# Patient Record
Sex: Female | Born: 1955
Health system: Southern US, Community
[De-identification: ages and names within clinical notes are randomized; demographics above are authoritative.]

## PROBLEM LIST (undated history)

## (undated) DIAGNOSIS — E079 Disorder of thyroid, unspecified: Secondary | ICD-10-CM

## (undated) DIAGNOSIS — I10 Essential (primary) hypertension: Secondary | ICD-10-CM

---

## 2004-10-04 ENCOUNTER — Other Ambulatory Visit: Admission: RE | Admit: 2004-10-04 | Discharge: 2004-10-04 | Payer: Self-pay | Admitting: Obstetrics and Gynecology

## 2004-10-05 ENCOUNTER — Encounter: Admission: RE | Admit: 2004-10-05 | Discharge: 2004-10-05 | Payer: Self-pay | Admitting: Obstetrics and Gynecology

## 2005-02-21 ENCOUNTER — Encounter: Admission: RE | Admit: 2005-02-21 | Discharge: 2005-02-21 | Payer: Self-pay | Admitting: Internal Medicine

## 2005-10-31 ENCOUNTER — Encounter: Admission: RE | Admit: 2005-10-31 | Discharge: 2005-10-31 | Payer: Self-pay | Admitting: Obstetrics and Gynecology

## 2005-11-14 ENCOUNTER — Other Ambulatory Visit: Admission: RE | Admit: 2005-11-14 | Discharge: 2005-11-14 | Payer: Self-pay | Admitting: Obstetrics and Gynecology

## 2006-11-13 ENCOUNTER — Encounter: Admission: RE | Admit: 2006-11-13 | Discharge: 2006-11-13 | Payer: Self-pay | Admitting: Obstetrics and Gynecology

## 2006-11-18 ENCOUNTER — Other Ambulatory Visit: Admission: RE | Admit: 2006-11-18 | Discharge: 2006-11-18 | Payer: Self-pay | Admitting: Obstetrics and Gynecology

## 2007-11-14 ENCOUNTER — Encounter: Admission: RE | Admit: 2007-11-14 | Discharge: 2007-11-14 | Payer: Self-pay | Admitting: Obstetrics and Gynecology

## 2007-11-19 ENCOUNTER — Other Ambulatory Visit: Admission: RE | Admit: 2007-11-19 | Discharge: 2007-11-19 | Payer: Self-pay | Admitting: Obstetrics and Gynecology

## 2008-11-22 ENCOUNTER — Other Ambulatory Visit: Admission: RE | Admit: 2008-11-22 | Discharge: 2008-11-22 | Payer: Self-pay | Admitting: Obstetrics and Gynecology

## 2008-12-27 ENCOUNTER — Encounter: Admission: RE | Admit: 2008-12-27 | Discharge: 2008-12-27 | Payer: Self-pay | Admitting: Obstetrics and Gynecology

## 2009-01-11 ENCOUNTER — Emergency Department (HOSPITAL_COMMUNITY): Admission: EM | Admit: 2009-01-11 | Discharge: 2009-01-12 | Payer: Self-pay | Admitting: Emergency Medicine

## 2009-10-05 ENCOUNTER — Encounter: Admission: RE | Admit: 2009-10-05 | Discharge: 2009-10-05 | Payer: Self-pay | Admitting: Internal Medicine

## 2009-11-25 ENCOUNTER — Ambulatory Visit (HOSPITAL_COMMUNITY): Admission: RE | Admit: 2009-11-25 | Discharge: 2009-11-25 | Payer: Self-pay | Admitting: Internal Medicine

## 2009-12-27 ENCOUNTER — Other Ambulatory Visit
Admission: RE | Admit: 2009-12-27 | Discharge: 2009-12-27 | Payer: Self-pay | Source: Home / Self Care | Admitting: Obstetrics and Gynecology

## 2010-01-11 ENCOUNTER — Encounter
Admission: RE | Admit: 2010-01-11 | Discharge: 2010-01-11 | Payer: Self-pay | Source: Home / Self Care | Attending: Obstetrics and Gynecology | Admitting: Obstetrics and Gynecology

## 2010-01-11 ENCOUNTER — Encounter
Admission: RE | Admit: 2010-01-11 | Discharge: 2010-01-11 | Payer: Self-pay | Source: Home / Self Care | Attending: Family Medicine | Admitting: Family Medicine

## 2010-03-26 LAB — COMPREHENSIVE METABOLIC PANEL
AST: 24 U/L (ref 0–37)
Albumin: 4 g/dL (ref 3.5–5.2)
Calcium: 8.9 mg/dL (ref 8.4–10.5)
Chloride: 104 mEq/L (ref 96–112)
Creatinine, Ser: 0.72 mg/dL (ref 0.4–1.2)
GFR calc Af Amer: >60 mL/min (ref >60)

## 2010-03-26 LAB — CBC
MCHC: 33.2 g/dL (ref 30.0–36.0)
MCV: 92.3 fL (ref 78.0–100.0)
Platelets: 247 10*3/uL (ref 150–400)
WBC: 9.3 10*3/uL (ref 4.0–10.5)

## 2010-03-26 LAB — URINALYSIS, ROUTINE W REFLEX MICROSCOPIC
Bilirubin Urine: NEGATIVE
Hgb urine dipstick: NEGATIVE
Nitrite: NEGATIVE
Specific Gravity, Urine: 1.032 — ABNORMAL HIGH (ref 1.005–1.030)
pH: 5.5 (ref 5.0–8.0)

## 2010-03-26 LAB — DIFFERENTIAL
Eosinophils Relative: 0 % (ref 0–5)
Lymphocytes Relative: 15 % (ref 12–46)
Lymphs Abs: 1.4 10*3/uL (ref 0.7–4.0)
Monocytes Absolute: 0.3 10*3/uL (ref 0.1–1.0)
Monocytes Relative: 4 % (ref 3–12)

## 2010-05-14 ENCOUNTER — Emergency Department (HOSPITAL_COMMUNITY)
Admission: EM | Admit: 2010-05-14 | Discharge: 2010-05-15 | Disposition: A | Discharge status: Home / Self Care | Payer: BC Managed Care – PPO | Attending: Emergency Medicine | Admitting: Emergency Medicine

## 2010-05-14 ENCOUNTER — Emergency Department (HOSPITAL_COMMUNITY): Payer: BC Managed Care – PPO

## 2010-05-14 DIAGNOSIS — S0003XA Contusion of scalp, initial encounter: Secondary | ICD-10-CM | POA: Insufficient documentation

## 2010-05-14 DIAGNOSIS — R51 Headache: Secondary | ICD-10-CM | POA: Insufficient documentation

## 2010-05-14 DIAGNOSIS — IMO0002 Reserved for concepts with insufficient information to code with codable children: Secondary | ICD-10-CM | POA: Insufficient documentation

## 2010-05-14 DIAGNOSIS — Z79899 Other long term (current) drug therapy: Secondary | ICD-10-CM | POA: Insufficient documentation

## 2010-05-14 DIAGNOSIS — S01501A Unspecified open wound of lip, initial encounter: Secondary | ICD-10-CM | POA: Insufficient documentation

## 2010-05-14 DIAGNOSIS — S01502A Unspecified open wound of oral cavity, initial encounter: Secondary | ICD-10-CM | POA: Insufficient documentation

## 2010-05-14 DIAGNOSIS — S0000XA Unspecified superficial injury of scalp, initial encounter: Secondary | ICD-10-CM | POA: Insufficient documentation

## 2010-05-14 DIAGNOSIS — K0889 Other specified disorders of teeth and supporting structures: Secondary | ICD-10-CM | POA: Insufficient documentation

## 2010-05-14 DIAGNOSIS — R220 Localized swelling, mass and lump, head: Secondary | ICD-10-CM | POA: Insufficient documentation

## 2010-05-14 DIAGNOSIS — S0120XA Unspecified open wound of nose, initial encounter: Secondary | ICD-10-CM | POA: Insufficient documentation

## 2010-05-14 DIAGNOSIS — S02109A Fracture of base of skull, unspecified side, initial encounter for closed fracture: Secondary | ICD-10-CM | POA: Insufficient documentation

## 2010-05-14 DIAGNOSIS — S025XXA Fracture of tooth (traumatic), initial encounter for closed fracture: Secondary | ICD-10-CM | POA: Insufficient documentation

## 2010-05-14 DIAGNOSIS — S0510XA Contusion of eyeball and orbital tissues, unspecified eye, initial encounter: Secondary | ICD-10-CM | POA: Insufficient documentation

## 2010-05-14 DIAGNOSIS — S022XXA Fracture of nasal bones, initial encounter for closed fracture: Secondary | ICD-10-CM | POA: Insufficient documentation

## 2010-05-14 DIAGNOSIS — S0280XA Fracture of other specified skull and facial bones, unspecified side, initial encounter for closed fracture: Secondary | ICD-10-CM | POA: Insufficient documentation

## 2010-05-14 DIAGNOSIS — Z189 Retained foreign body fragments, unspecified material: Secondary | ICD-10-CM | POA: Insufficient documentation

## 2010-05-14 DIAGNOSIS — E039 Hypothyroidism, unspecified: Secondary | ICD-10-CM | POA: Insufficient documentation

## 2010-05-14 DIAGNOSIS — J342 Deviated nasal septum: Secondary | ICD-10-CM | POA: Insufficient documentation

## 2010-05-14 LAB — DIFFERENTIAL
Eosinophils Absolute: 0.1 10*3/uL (ref 0.0–0.7)
Eosinophils Relative: 1 % (ref 0–5)
Lymphs Abs: 2.4 10*3/uL (ref 0.7–4.0)
Monocytes Absolute: 0.6 10*3/uL (ref 0.1–1.0)

## 2010-05-14 LAB — COMPREHENSIVE METABOLIC PANEL
ALT: 15 U/L (ref 0–35)
AST: 18 U/L (ref 0–37)
CO2: 25 mEq/L (ref 19–32)
Calcium: 9.5 mg/dL (ref 8.4–10.5)
Chloride: 104 mEq/L (ref 96–112)
GFR calc Af Amer: >60 mL/min (ref >60)
GFR calc non Af Amer: >60 mL/min (ref >60)
Sodium: 138 mEq/L (ref 135–145)

## 2010-05-14 LAB — CBC
MCH: 30.6 pg (ref 26.0–34.0)
MCHC: 34 g/dL (ref 30.0–36.0)
MCV: 90.1 fL (ref 78.0–100.0)
Platelets: 274 10*3/uL (ref 150–400)
RDW: 13.2 % (ref 11.5–15.5)
WBC: 10.2 10*3/uL (ref 4.0–10.5)
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
## 2010-12-28 ENCOUNTER — Other Ambulatory Visit (HOSPITAL_COMMUNITY)
Admission: RE | Admit: 2010-12-28 | Discharge: 2010-12-28 | Disposition: A | Discharge status: Home / Self Care | Payer: BC Managed Care – PPO | Source: Ambulatory Visit | Attending: Obstetrics and Gynecology | Admitting: Obstetrics and Gynecology

## 2010-12-28 ENCOUNTER — Other Ambulatory Visit: Payer: Self-pay | Admitting: Obstetrics and Gynecology

## 2010-12-28 DIAGNOSIS — Z01419 Encounter for gynecological examination (general) (routine) without abnormal findings: Secondary | ICD-10-CM | POA: Insufficient documentation

## 2011-01-29 ENCOUNTER — Other Ambulatory Visit: Payer: Self-pay | Admitting: Obstetrics and Gynecology

## 2011-01-29 ENCOUNTER — Ambulatory Visit
Admission: RE | Admit: 2011-01-29 | Discharge: 2011-01-29 | Disposition: A | Discharge status: Home / Self Care | Payer: BC Managed Care – PPO | Source: Ambulatory Visit | Attending: Obstetrics and Gynecology | Admitting: Obstetrics and Gynecology

## 2011-01-29 DIAGNOSIS — Z1231 Encounter for screening mammogram for malignant neoplasm of breast: Secondary | ICD-10-CM

## 2012-01-16 ENCOUNTER — Other Ambulatory Visit: Payer: Self-pay | Admitting: Obstetrics and Gynecology

## 2012-01-16 ENCOUNTER — Other Ambulatory Visit (HOSPITAL_COMMUNITY)
Admission: RE | Admit: 2012-01-16 | Discharge: 2012-01-16 | Disposition: A | Discharge status: Home / Self Care | Payer: 59 | Source: Ambulatory Visit | Attending: Obstetrics and Gynecology | Admitting: Obstetrics and Gynecology

## 2012-01-16 DIAGNOSIS — Z1151 Encounter for screening for human papillomavirus (HPV): Secondary | ICD-10-CM | POA: Insufficient documentation

## 2012-01-16 DIAGNOSIS — Z01419 Encounter for gynecological examination (general) (routine) without abnormal findings: Secondary | ICD-10-CM | POA: Insufficient documentation

## 2012-02-05 ENCOUNTER — Other Ambulatory Visit: Payer: Self-pay | Admitting: Obstetrics and Gynecology

## 2012-02-05 ENCOUNTER — Ambulatory Visit
Admission: RE | Admit: 2012-02-05 | Discharge: 2012-02-05 | Disposition: A | Discharge status: Home / Self Care | Payer: 59 | Source: Ambulatory Visit | Attending: Obstetrics and Gynecology | Admitting: Obstetrics and Gynecology

## 2012-02-05 DIAGNOSIS — Z1231 Encounter for screening mammogram for malignant neoplasm of breast: Secondary | ICD-10-CM

## 2012-02-07 ENCOUNTER — Other Ambulatory Visit: Payer: Self-pay | Admitting: Obstetrics and Gynecology

## 2012-02-07 DIAGNOSIS — R928 Other abnormal and inconclusive findings on diagnostic imaging of breast: Secondary | ICD-10-CM

## 2012-02-08 ENCOUNTER — Ambulatory Visit
Admission: RE | Admit: 2012-02-08 | Discharge: 2012-02-08 | Disposition: A | Discharge status: Home / Self Care | Payer: 59 | Source: Ambulatory Visit | Attending: Obstetrics and Gynecology | Admitting: Obstetrics and Gynecology

## 2012-02-08 DIAGNOSIS — R928 Other abnormal and inconclusive findings on diagnostic imaging of breast: Secondary | ICD-10-CM

## 2012-03-04 ENCOUNTER — Ambulatory Visit: Payer: 59

## 2012-11-17 ENCOUNTER — Ambulatory Visit (HOSPITAL_COMMUNITY)
Admission: RE | Admit: 2012-11-17 | Discharge: 2012-11-17 | Disposition: A | Discharge status: Home / Self Care | Payer: 59 | Source: Ambulatory Visit | Attending: Pulmonary Disease | Admitting: Pulmonary Disease

## 2012-11-17 DIAGNOSIS — IMO0001 Reserved for inherently not codable concepts without codable children: Secondary | ICD-10-CM | POA: Insufficient documentation

## 2012-11-17 DIAGNOSIS — R269 Unspecified abnormalities of gait and mobility: Secondary | ICD-10-CM | POA: Insufficient documentation

## 2012-11-17 DIAGNOSIS — M25559 Pain in unspecified hip: Secondary | ICD-10-CM | POA: Insufficient documentation

## 2012-11-17 DIAGNOSIS — M25552 Pain in left hip: Secondary | ICD-10-CM

## 2012-11-17 NOTE — Evaluation (Signed)
Physical Therapy Evaluation  Patient Details  Name: Lauren Yates MRN: [id number] Date of Birth: [birth year]  Today's Date: 11/17/2012 Time: 1320-1355 PT Time Calculation (min): 35 min              Visit#: 1 of 8  Re-eval: 12/17/12 Assessment Diagnosis: Lt hip pain Next MD Visit: Dr. Juanetta Gosling  Subjective Symptoms/Limitations Symptoms: Pt is a 57 year old female referred to PT for Lt hip pain which she reports started when she was running which started on October 18th when she was running her 5K and ran then entire 3.2 miles and was training 5 minutes running and 1 minute walking.  She reports that she has pain when she is walking and running.  She reports anterior and groin pain.   Pertinent History: hx of SI dysfunction and herniated disc.  How long can you walk comfortably?: walking is worse when she first starts and improves after she runs and walks  Patient Stated Goals: decrease pain when walking and running.  Pain Assessment Currently in Pain?: Yes Pain Score: 5  Pain Location: Hip Pain Orientation: Left Pain Type: Acute pain Pain Onset: 1 to 4 weeks ago Pain Relieving Factors: naporsen  Effect of Pain on Daily Activities: difficulty running  Prior Functioning  Prior Function Driving: Yes Vocation: Full time employment Vocation Requirements: Insurance account manager from [employer] Comments: She enjoys running  Cognition/Observation Observation/Other Assessments Observations: Scolosis  Sensation/Coordination/Flexibility/Functional Tests Coordination Gross Motor Movements are Fluid and Coordinated: No Coordination and Movement Description: impaired Lt transverese abdominus musculature Flexibility Thomas: Positive Obers: Positive 90/90: Positive Functional Tests Functional Tests: + FABER, + Ely's Functional Tests: FOTO: 44/56  Assessment LLE Strength Left Hip Flexion: 5/5 (painful) Left Hip Extension: 4/5 Left Hip ABduction: 5/5 (painful) Left Hip ADduction: 5/5 Left  Knee Flexion: 4/5 Lumbar Assessment Lumbar Assessment: Exceptions to [facility] Lumbar AROM Overall Lumbar AROM Comments: WFL with approriate SI motion Lumbar Strength Overall Lumbar Strength Comments: Obliques: Lt 4/5, Rt: 5/5 Lumbar Flexion: 5/5 Lumbar Extension: 4/5 Palpation Palpation: pain and tenderness to her Lt groin and maximal muscle spasm to her Lt adductor origin.   Mobility/Balance  Ambulation/Gait Ambulation/Gait: Yes Gait Pattern: Antalgic;Decreased trunk rotation;Lateral hip instability Posture/Postural Control Posture/Postural Control: Postural limitations Postural Limitations: slouched, upper cross   Exercise/Treatments Stretches  IT band 1x20 seconds Hip abduction 1x20 seconds Quadriceps stretch 1x30 seconds prone Hip flexor stretch 1x30 seconds prone with towel roll under knee  Physical Therapy Assessment and Plan PT Assessment and Plan Clinical Impression Statement: Pt is a 57 year old female referred to PT for Lt hip pain with impairments listed below.  After evaluation it is found that she is mostly limited by muscle spasms and increased muscle tone to her hip adductors, quadriceps and hamstrings causing increased pain and impaired coordinated movements to her core muscles.  Pt requires education and cueing for proper posture.  Pt will benefit from skilled therapeutic intervention in order to improve on the following deficits: Abnormal gait;Pain;Decreased strength;Impaired perceived functional ability;Increased fascial restricitons;Increased muscle spasms;Decreased range of motion PT Frequency: Min 2X/week PT Duration: 4 weeks PT Treatment/Interventions: Gait training;Stair training;Functional mobility training;Therapeutic activities;Therapeutic exercise;Balance training;Neuromuscular re-education;Patient/family education;Manual techniques PT Plan: Continue with manual techniques to decrease hip pain.  Improve hip strengthenign to improve gait mechanics (S/L hip  abduction, hip circles, rainbows), squats, quad and hip flexor stretch, gastroc stretch.    Goals Home Exercise Program Pt/caregiver will Perform Home Exercise Program: Independently PT Short Term Goals Time to Complete Short Term Goals:  2 weeks PT Short Term Goal 1: Pt will report Lt hip pain less than 2/10 when walking.  PT Short Term Goal 2: Pt will present with minimal fascial restrictions to her hip and LE musculature to report decreased pain and improve LE flexibilty PT Short Term Goal 3: Pt will perform all MMT without increased pain with resistance. PT Long Term Goals Time to Complete Long Term Goals: 4 weeks PT Long Term Goal 1: Pt will improve Lt hip strength to WNL in order to ambulate and jog without an anatalgic gait to decrease risk of secondary injuries.  PT Long Term Goal 2: Pt will improve her FOTO to status greater than 60 and limiation less than 40 for improved percieved functional ability.    Problem List Patient Active Problem List   Diagnosis Date Noted  . Left hip pain 11/17/2012    General Behavior During Therapy: [facility] for tasks assessed/performed PT Plan of Care PT Home Exercise Plan: given PT Patient Instructions: discussed self massagers, re-joining yoga, posture, core strength Consulted and Agree with Plan of Care: Patient  GP    Kayla Deshaies, MPT, ATC 11/17/2012, 5:48 PM  Physician Documentation Your signature is required to indicate approval of the treatment plan as stated above.  Please sign and either send electronically or make a copy of this report for your files and return this physician signed original.   Please mark one 1.__approve of plan  2. ___approve of plan with the following conditions.   ______________________________                                                          _____________________ Physician Signature                                                                                                             Date

## 2012-11-20 ENCOUNTER — Ambulatory Visit (HOSPITAL_COMMUNITY): Payer: 59

## 2012-11-24 ENCOUNTER — Ambulatory Visit (HOSPITAL_COMMUNITY)
Admission: RE | Admit: 2012-11-24 | Discharge: 2012-11-24 | Disposition: A | Discharge status: Home / Self Care | Payer: 59 | Source: Ambulatory Visit | Attending: Pulmonary Disease | Admitting: Pulmonary Disease

## 2012-11-24 DIAGNOSIS — M25552 Pain in left hip: Secondary | ICD-10-CM

## 2012-11-24 NOTE — Progress Notes (Signed)
Physical Therapy Treatment Patient Details  Name: Lauren Yates MRN: [id number] Date of Birth: 1955-10-22  Today's Date: 11/24/2012 Time: 4098-1191 PT Time Calculation (min): 46 min Charge: Manual 1442-1500, TE 4782-9562  Visit#: 2 of 8  Re-eval: 12/17/12 Assessment Diagnosis: Lt hip pain Next MD Visit: Dr. Juanetta Gosling  Subjective: Symptoms/Limitations Symptoms: Pt stated HEP exercises have been helping with pain.  Stated pain scale 4/10 Lt thigh and back.  Reports running 2 1/2 miles this weekend. Pain Assessment Currently in Pain?: Yes Pain Score: 4  Pain Location: Hip Pain Orientation: Left  Objective:   Exercise/Treatments Stretches Quad Stretch: 3 reps;30 seconds Hip Flexor Stretch: Limitations;3 reps;30 seconds Hip Flexor Stretch Limitations: Prone: manual passive stretch; piegon yoga pose ITB Stretch: 3 reps;30 seconds Piriformis Stretch: 3 reps;30 seconds;Limitations Piriformis Stretch Limitations: pigeon yoga pose Sidelying Hip ABduction: 15 reps;Left;Limitations Hip ABduction Limitations: rainbow 15x, circles forward/backwards 10x  Manual Therapy Manual Therapy: Myofascial release Myofascial Release: MFR to anterior Lt thigh  Physical Therapy Assessment and Plan PT Assessment and Plan Clinical Impression Statement: Manual techniques complete to reduce fascial restriction and passive stretches complete to iliopsoas and hip musculautre.  Began strenghtening exercsies for glut med with min cueing required for form and technique, pt able to demonstrate appropriate technique following ilitial cueing.  Pt educated on importance of appropriate gait mechnaics to assist with posture and pain.  Pt reported pain scale 2/10 at end of session.  Pt encouraged to drink extra water to reduce risk of headaches following manual.   PT Plan: Continue with manual techniques to decrease hip pain.  Improve hip strengthenign to improve gait mechanics (S/L hip abduction, hip circles,  rainbows), squats, quad and hip flexor stretch, gastroc stretch.    Goals Home Exercise Program Pt/caregiver will Perform Home Exercise Program: Independently PT Short Term Goals Time to Complete Short Term Goals: 2 weeks PT Short Term Goal 1: Pt will report Lt hip pain less than 2/10 when walking.  PT Short Term Goal 1 - Progress: Progressing toward goal PT Short Term Goal 2: Pt will present with minimal fascial restrictions to her hip and LE musculature to report decreased pain and improve LE flexibilty PT Short Term Goal 2 - Progress: Progressing toward goal PT Short Term Goal 3: Pt will perform all MMT without increased pain with resistance. PT Long Term Goals Time to Complete Long Term Goals: 4 weeks PT Long Term Goal 1: Pt will improve Lt hip strength to WNL in order to ambulate and jog without an anatalgic gait to decrease risk of secondary injuries.  PT Long Term Goal 1 - Progress: Progressing toward goal PT Long Term Goal 2: Pt will improve her FOTO to status greater than 60 and limiation less than 40 for improved percieved functional ability.    Problem List Patient Active Problem List   Diagnosis Date Noted  . Left hip pain 11/17/2012    PT - End of Session Activity Tolerance: Patient tolerated treatment well General Behavior During Therapy: [facility] for tasks assessed/performed  GP    Juel Burrow 11/24/2012, 4:22 PM

## 2012-11-26 ENCOUNTER — Ambulatory Visit (HOSPITAL_COMMUNITY)
Admission: RE | Admit: 2012-11-26 | Discharge: 2012-11-26 | Disposition: A | Discharge status: Home / Self Care | Payer: 59 | Source: Ambulatory Visit | Attending: Pulmonary Disease | Admitting: Pulmonary Disease

## 2012-11-26 DIAGNOSIS — M25552 Pain in left hip: Secondary | ICD-10-CM

## 2012-11-26 NOTE — Progress Notes (Signed)
Physical Therapy Treatment Patient Details  Name: Lauren Yates MRN: [id number] Date of Birth: 11-Aug-1955  Today's Date: 11/26/2012 Time: 7829-5621 PT Time Calculation (min): 46 min Charges:  TE: 3086-5784 Manual: 1442-1510 Ice: 1510-1520 Visit#: 3 of 8  Re-eval: 12/17/12   Subjective: Symptoms/Limitations Symptoms: reports that she still has pain when she walks too long. states she ran yesterday and it didnt hurt as much and now she is paying for it.  Pain Assessment Currently in Pain?: Yes Pain Score: 5  Pain Location: Hip Pain Orientation: Left  Precautions/Restrictions     Exercise/Treatments Standing Lateral Step Up: Both;15 reps;Step Height: 4";Limitations Lateral Step Up Limitations: Hip Hikes: Both 10 reps Rocker Board: 2 minutes  Modalities Modalities: Cryotherapy Manual Therapy Manual Therapy: Other (comment) Myofascial Release: to Lt anterior thigh, adductor and hamstring with connective tissure release and friction massage to decrease fascial restriction and muscle spasms after SCS.  Other Manual Therapy: strain counter strain (SCS) to adductor insertion x4 locations with 75% release  Cryotherapy Number Minutes Cryotherapy: 10 Minutes Cryotherapy Location: Hip Type of Cryotherapy: Ice pack  Physical Therapy Assessment and Plan PT Assessment and Plan Clinical Impression Statement: Added standing exercises with pictures for pt to continue at home to improve gluteus medius coordinated movement and strength.  Pt agreeable to stop running x1 week to allow for healing and will continue on elliptical and UBE and with weight machines maintain fitness and decrease pain.  Enocurage pt to continue with yoga and discussed use of chondrotin/glucosamine supplements for joint pain and to f/u MD to discuss.  PT Plan: Continue with manual techniques to decrease hip pain.  Improve hip strengthenign to improve gait mechanics (S/L hip abduction, hip circles, rainbows),  squats, quad and hip flexor stretch, gastroc stretch.    Goals    Problem List Patient Active Problem List   Diagnosis Date Noted  . Left hip pain 11/17/2012    PT - End of Session Activity Tolerance: Patient tolerated treatment well General Behavior During Therapy: WFL for tasks assessed/performed PT Plan of Care PT Home Exercise Plan: updated with SLR, external SLR, hip rainbows and hip hikes PT Patient Instructions: chondroitin/glucosamine, ice after running, yoga Consulted and Agree with Plan of Care: Patient  GP    Lauren Yates, MPT, ATC 11/26/2012, 4:17 PM

## 2012-12-01 ENCOUNTER — Ambulatory Visit (HOSPITAL_COMMUNITY): Payer: 59

## 2012-12-03 ENCOUNTER — Ambulatory Visit (HOSPITAL_COMMUNITY)
Admission: RE | Admit: 2012-12-03 | Discharge: 2012-12-03 | Disposition: A | Discharge status: Home / Self Care | Payer: 59 | Source: Ambulatory Visit | Attending: Pulmonary Disease | Admitting: Pulmonary Disease

## 2012-12-03 DIAGNOSIS — M25552 Pain in left hip: Secondary | ICD-10-CM

## 2012-12-03 NOTE — Progress Notes (Signed)
Physical Therapy Treatment Patient Details  Name: Lauren Yates MRN: [id number] Date of Birth: 28-May-1955  Today's Date: 12/03/2012 Time: 7829-5621 PT Time Calculation (min): 30 min Charge: Manual 3086-5784  Visit#: 4 of 8  Re-eval: 12/17/12   Subjective: Symptoms/Limitations Symptoms: Pt reorted Lt hip is feeling better, has been completing [employer] yoga, walking and weight lifting at the gym.  Has not returned to running Pain Assessment Currently in Pain?: Yes Pain Score: 2  Pain Location: Hip Pain Orientation: Left  Objective:  Exercise/Treatments Manual Therapy Myofascial Release: to Lt anterior thigh, adductor and hamstring with connective tissure release and friction massage to decrease fascial restriction and muscle spasms after SCS  Physical Therapy Assessment and Plan PT Assessment and Plan Clinical Impression Statement: Sesion focus on manual techniques to reduce fascial restrictions to reduce pain.  Noted vast improvements in reduce fascial restrictions.  Pt encouraged to continue with yoga and stretching, continue to hold on running. PT Plan: Continue with manual techniques to decrease hip pain.  Improve hip strengthenign to improve gait mechanics (S/L hip abduction, hip circles, rainbows), squats, quad and hip flexor stretch, gastroc stretch.    Goals Home Exercise Program Pt/caregiver will Perform Home Exercise Program: Independently PT Short Term Goals Time to Complete Short Term Goals: 2 weeks PT Short Term Goal 1: Pt will report Lt hip pain less than 2/10 when walking.  PT Short Term Goal 1 - Progress: Progressing toward goal PT Short Term Goal 2: Pt will present with minimal fascial restrictions to her hip and LE musculature to report decreased pain and improve LE flexibilty PT Short Term Goal 2 - Progress: Progressing toward goal PT Short Term Goal 3: Pt will perform all MMT without increased pain with resistance. PT Long Term Goals Time to Complete Long  Term Goals: 4 weeks PT Long Term Goal 1: Pt will improve Lt hip strength to WNL in order to ambulate and jog without an anatalgic gait to decrease risk of secondary injuries.  PT Long Term Goal 2: Pt will improve her FOTO to status greater than 60 and limiation less than 40 for improved percieved functional ability.    Problem List Patient Active Problem List   Diagnosis Date Noted  . Left hip pain 11/17/2012    PT - End of Session Activity Tolerance: Patient tolerated treatment well General Behavior During Therapy: [facility] for tasks assessed/performed  GP    Juel Burrow 12/03/2012, 4:19 PM

## 2012-12-08 ENCOUNTER — Ambulatory Visit (HOSPITAL_COMMUNITY)
Admission: RE | Admit: 2012-12-08 | Discharge: 2012-12-08 | Disposition: A | Discharge status: Home / Self Care | Payer: 59 | Source: Ambulatory Visit | Attending: Pulmonary Disease | Admitting: Pulmonary Disease

## 2012-12-08 DIAGNOSIS — M25552 Pain in left hip: Secondary | ICD-10-CM

## 2012-12-08 DIAGNOSIS — M25559 Pain in unspecified hip: Secondary | ICD-10-CM | POA: Insufficient documentation

## 2012-12-08 DIAGNOSIS — R269 Unspecified abnormalities of gait and mobility: Secondary | ICD-10-CM | POA: Insufficient documentation

## 2012-12-08 DIAGNOSIS — IMO0001 Reserved for inherently not codable concepts without codable children: Secondary | ICD-10-CM | POA: Insufficient documentation

## 2012-12-08 NOTE — Progress Notes (Signed)
Physical Therapy Treatment Patient Details  Name: Lauren Yates MRN: [id number] Date of Birth: [birth year]  Today's Date: 12/08/2012 Time: 4098-1191 PT Time Calculation (min): 40 min Charges: Manual: [id number] TE: 1505-1510 Visit#: 5 of 8  Re-eval: 12/17/12    Authorization:    Authorization Time Period:    Authorization Visit#:   of     Subjective: Symptoms/Limitations Symptoms: Reports she is still not running.  discussed with patient about her bunions she has had for years.  Feels her feet are very weak.   Precautions/Restrictions     Exercise/Treatments Ankle Exercises - Seated Towel Crunch: 1 rep Towel Inversion/Eversion: 1 rep;Weights Towel Inversion/Eversion Weights (lbs): 5 Marble Pickup: 1x10 marbles Other Seated Ankle Exercises: Toe yoga with max cueing and PT facilation Manual Therapy Manual Therapy: Joint mobilization Joint Mobilization: Grade II-III to Rt and Lt metatarsals to improve mobility.  Myofascial Release: to Lt anterior thigh, adductor and hamstring with connective tissure release and friction massage to decrease fascial restriction and muscle spasms.  BLE feet to decrease pain to plantar fascia.   Physical Therapy Assessment and Plan PT Assessment and Plan Clinical Impression Statement: Continued to address adductor pain.  With further conversation with patient, she has great discomfort and notable bunion to Lt foot.  Took MMT of ankle with following findings: Great Toe extension 3+/5; Great toe flexion 2+/5, DF: 5/5, PF: 3-/5, In: 3+/5, Ev: 3/5,.  Added exercises to improve foot and ankle function with signficant adductor compensation with exercises.  PT Plan: contined with focus on foot exercises and core exercises.     Goals    Problem List Patient Active Problem List   Diagnosis Date Noted  . Left hip pain 11/17/2012    PT - End of Session Activity Tolerance: Patient tolerated treatment well General Behavior During Therapy: [facility] for  tasks assessed/performed PT Plan of Care PT Home Exercise Plan: updated with foot exercises Consulted and Agree with Plan of Care: Patient  GP    Lauren Yates 12/08/2012, 6:28 PM

## 2012-12-10 ENCOUNTER — Ambulatory Visit (HOSPITAL_COMMUNITY)
Admission: RE | Admit: 2012-12-10 | Discharge: 2012-12-10 | Disposition: A | Discharge status: Home / Self Care | Payer: 59 | Source: Ambulatory Visit | Attending: Physical Therapy | Admitting: Physical Therapy

## 2012-12-10 DIAGNOSIS — M25552 Pain in left hip: Secondary | ICD-10-CM

## 2012-12-10 NOTE — Progress Notes (Signed)
Physical Therapy Treatment Patient Details  Name: Lauren [place] MRN: [id number] Date of Birth: 04-11-1955  Today's Date: 12/10/2012 Time: 1515-1600 PT Time Calculation (min): 45 min Charges:  TE: 45 Visit#: 6 of 8  Re-eval: 12/17/12    Subjective: Symptoms/Limitations Symptoms: Pt reports that she ran a few steps last and sitll has pain, but it does not hurt as bad. Pain Assessment Pain Score: 2   Exercise/Treatments Machines for Strengthening Cybex Knee Extension: Eccentric lowering BLE 37.5 lbs 1x10 reps each Cybex Knee Flexion: BLE 2x10 BLE 57.5lbs Standing Lateral Step Up: Both;15 reps;Hand Hold: 0;Step Height: 6" Lateral Step Up Limitations: Hip Hikes: Both 10 reps Forward Step Up: Both;20 reps;Limitations;Hand Hold: 0;Step Height: 6" Forward Step Up Limitations: with power ups Walking with Sports Cord: thin: forward, backwards, lateral walking 1 RT each long hallway Ankle Exercises - Standing Heel Raises: 20 reps Ankle Exercises - Seated Towel Crunch: 3 reps Towel Inversion/Eversion: 5 reps;Weights Towel Inversion/Eversion Weights (lbs): 2 Marble Pickup: 3x10 Other Seated Ankle Exercises: Toe yoga with max cueing and PT facilation  Physical Therapy Assessment and Plan PT Assessment and Plan Clinical Impression Statement: Added strengthening activities to BLE that she can continue with at the [facility].  Edcated pt on foot exercises, proper orthotics and shoes.  Discussed activities and had pt demonstrate exercises she can complete at the gym.  Educated pt on 3 min run/1 min walk for exercise. Pt will f/u in 2 weeks to discuss progress and pain.  PT Plan: F/u on progress on her own.     Goals Home Exercise Program PT Goal: Perform Home Exercise Program - Progress: Met PT Short Term Goals Time to Complete Short Term Goals: 2 weeks PT Short Term Goal 1: Pt will report Lt hip pain less than 2/10 when walking.  PT Short Term Goal 1 - Progress: Met PT Short Term Goal 2:  Pt will present with minimal fascial restrictions to her hip and LE musculature to report decreased pain and improve LE flexibilty PT Short Term Goal 2 - Progress: Met PT Short Term Goal 3: Pt will perform all MMT without increased pain with resistance. PT Short Term Goal 3 - Progress: Met PT Long Term Goals Time to Complete Long Term Goals: 4 weeks PT Long Term Goal 1: Pt will improve Lt hip strength to WNL in order to ambulate and jog without an anatalgic gait to decrease risk of secondary injuries.  PT Long Term Goal 1 - Progress: Progressing toward goal PT Long Term Goal 2: Pt will improve her FOTO to status greater than 60 and limiation less than 40 for improved percieved functional ability.   PT Long Term Goal 2 - Progress: Progressing toward goal  Problem List Patient Active Problem List   Diagnosis Date Noted  . Left hip pain 11/17/2012    PT - End of Session Activity Tolerance: Patient tolerated treatment well General Behavior During Therapy: [facility] for tasks assessed/performed PT Plan of Care Consulted and Agree with Plan of Care: Patient  GP    Kasiyah Platter, MPT, ATC 12/10/2012, 3:27 PM

## 2013-01-14 ENCOUNTER — Other Ambulatory Visit: Payer: Self-pay

## 2013-01-14 DIAGNOSIS — Z1231 Encounter for screening mammogram for malignant neoplasm of breast: Secondary | ICD-10-CM

## 2013-01-15 ENCOUNTER — Other Ambulatory Visit: Payer: Self-pay | Admitting: Obstetrics and Gynecology

## 2013-01-15 ENCOUNTER — Other Ambulatory Visit (HOSPITAL_COMMUNITY)
Admission: RE | Admit: 2013-01-15 | Discharge: 2013-01-15 | Disposition: A | Discharge status: Home / Self Care | Payer: 59 | Source: Ambulatory Visit | Attending: Obstetrics and Gynecology | Admitting: Obstetrics and Gynecology

## 2013-01-15 DIAGNOSIS — Z01419 Encounter for gynecological examination (general) (routine) without abnormal findings: Secondary | ICD-10-CM | POA: Insufficient documentation

## 2013-01-15 DIAGNOSIS — Z1151 Encounter for screening for human papillomavirus (HPV): Secondary | ICD-10-CM | POA: Insufficient documentation

## 2013-02-03 ENCOUNTER — Other Ambulatory Visit: Payer: Self-pay | Admitting: Obstetrics and Gynecology

## 2013-02-09 ENCOUNTER — Ambulatory Visit
Admission: RE | Admit: 2013-02-09 | Discharge: 2013-02-09 | Disposition: A | Discharge status: Home / Self Care | Payer: Self-pay | Source: Ambulatory Visit

## 2013-02-09 DIAGNOSIS — Z1231 Encounter for screening mammogram for malignant neoplasm of breast: Secondary | ICD-10-CM

## 2013-08-07 ENCOUNTER — Other Ambulatory Visit: Payer: Self-pay | Admitting: Obstetrics and Gynecology

## 2013-08-07 ENCOUNTER — Other Ambulatory Visit (HOSPITAL_COMMUNITY)
Admission: RE | Admit: 2013-08-07 | Discharge: 2013-08-07 | Disposition: A | Discharge status: Home / Self Care | Payer: 59 | Source: Ambulatory Visit | Attending: Obstetrics and Gynecology | Admitting: Obstetrics and Gynecology

## 2013-08-07 DIAGNOSIS — Z01419 Encounter for gynecological examination (general) (routine) without abnormal findings: Secondary | ICD-10-CM | POA: Diagnosis not present

## 2013-08-11 LAB — CYTOLOGY - PAP

## 2013-09-19 ENCOUNTER — Emergency Department (HOSPITAL_COMMUNITY)
Admission: EM | Admit: 2013-09-19 | Discharge: 2013-09-19 | Disposition: A | Discharge status: Home / Self Care | Payer: 59 | Source: Home / Self Care | Attending: Family Medicine | Admitting: Family Medicine

## 2013-09-19 ENCOUNTER — Encounter (HOSPITAL_COMMUNITY): Payer: Self-pay | Admitting: Emergency Medicine

## 2013-09-19 ENCOUNTER — Emergency Department (INDEPENDENT_AMBULATORY_CARE_PROVIDER_SITE_OTHER): Payer: 59

## 2013-09-19 DIAGNOSIS — S62309A Unspecified fracture of unspecified metacarpal bone, initial encounter for closed fracture: Secondary | ICD-10-CM

## 2013-09-19 DIAGNOSIS — W010XXA Fall on same level from slipping, tripping and stumbling without subsequent striking against object, initial encounter: Secondary | ICD-10-CM

## 2013-09-19 DIAGNOSIS — IMO0002 Reserved for concepts with insufficient information to code with codable children: Secondary | ICD-10-CM

## 2013-09-19 DIAGNOSIS — S62308A Unspecified fracture of other metacarpal bone, initial encounter for closed fracture: Secondary | ICD-10-CM

## 2013-09-19 DIAGNOSIS — S060X0A Concussion without loss of consciousness, initial encounter: Secondary | ICD-10-CM

## 2013-09-19 DIAGNOSIS — T148XXA Other injury of unspecified body region, initial encounter: Secondary | ICD-10-CM

## 2013-09-19 HISTORY — DX: Essential (primary) hypertension: I10

## 2013-09-19 HISTORY — DX: Disorder of thyroid, unspecified: E07.9

## 2013-09-19 NOTE — ED Provider Notes (Signed)
CSN: [id number]     Arrival date & time 09/19/13  1213 History   First MD Initiated Contact with Patient 09/19/13 1242     Chief Complaint  Patient presents with  . Fall   (Consider location/radiation/quality/duration/timing/severity/associated sxs/prior Treatment) HPI  RUnning today around 8:30. Going across bridge and tripped when looking at other runners. R hand extended and broke fall. Hit face on pavement. Wearing glasses at time. Certain motions hurt the lateral hand.  Head trauma: No syncope. HA. Delay in overall thought process per pt. AOx3.  Past Medical History  Diagnosis Date  . Hypertension   . Thyroid disease    Past Surgical History  Procedure Laterality Date  . Cesarean section     History reviewed. No pertinent family history. History  Substance Use Topics  . Smoking status: Never Smoker   . Smokeless tobacco: Not on file  . Alcohol Use: No   OB History   Grav Para Term Preterm Abortions TAB SAB Ect Mult Living                 Review of Systems Per HPI with all other pertinent systems negative.   Allergies  Morphine and related and Penicillins  Home Medications   Prior to Admission medications   Medication Sig Start Date End Date Taking? Authorizing Provider  Citalopram Hydrobromide (CELEXA PO) Take by mouth.   Yes Historical Provider, MD  Levothyroxine Sodium (SYNTHROID PO) Take by mouth.   Yes Historical Provider, MD  LISINOPRIL PO Take by mouth.   Yes Historical Provider, MD   BP 104/74  Pulse 62  Temp(Src) 98.5 F (36.9 C) (Oral)  Resp 18  SpO2 99% Physical Exam  Constitutional: She is oriented to person, place, and time. She appears well-developed and well-nourished. No distress.  HENT:  Head: Normocephalic.  nemerous facial abrasions in the midline, most pronounced over the nasal bridge in the distribution of her glasses. No nasal bridge instability   Eyes: EOM are normal. Pupils are equal, round, and reactive to light.  Neck: Normal  range of motion. Neck supple.  Cardiovascular: Normal rate, normal heart sounds and intact distal pulses.   No murmur heard. Pulmonary/Chest: Effort normal and breath sounds normal.  Abdominal: Soft. She exhibits no distension.  Musculoskeletal: Normal range of motion. She exhibits no edema and no tenderness.  Neurological: She is alert and oriented to person, place, and time. No cranial nerve deficit. She exhibits normal muscle tone. Coordination normal.  Skin: Skin is warm and dry. She is not diaphoretic.  Psychiatric: She has a normal mood and affect. Her behavior is normal. Judgment and thought content normal.    ED Course  Procedures (including critical care time) Labs Review Labs Reviewed - No data to display  Imaging Review Dg Hand Complete Right  09/19/2013   CLINICAL DATA:  History of trauma from a fall complaining of pain in the fifth metacarpal and limited range of motion.  EXAM: RIGHT HAND - COMPLETE 3+ VIEW  COMPARISON:  No priors.  FINDINGS: Three views of the left hand demonstrate an oblique fracture through the base of the fourth metacarpal, which appears nondisplaced. No other acute displaced fracture, subluxation or dislocation is noted.  IMPRESSION: Acute nondisplaced oblique fracture through the base of the fourth metacarpal.   Electronically Signed   By: Vinnie Langton M.D.   On: 09/19/2013 13:56     MDM   1. Abrasion   2. Closed fracture of 4th metacarpal, initial encounter  3. Concussion, without loss of consciousness, initial encounter    Abrasions: antibiotic ointment. F/u PRN  Concussion: mild. Discussed treatment and recovery at great length. Pt well aware of warning signs of concussion and worsening symptoms.  Fracture: ulnar gutter applied by me. Discussed case w/ Dr. Burney Gauze. Pt to f/u on Tue or Wed. NSAIDs and tylenol prn pain  Precautions given and all questions answered  Linna Darner, MD Family Medicine 09/19/2013, 2:35 PM      Waldemar Dickens, MD 09/19/13 1436

## 2013-09-19 NOTE — Discharge Instructions (Signed)
You fractured your right hand Please call the hand specialists on Monday to set up an appointment on Tuesday or Wednesday Please use ibuprofen or tylenol for pain relief You likely also suffered a mild concussion Please take it easy for the next couple of days to help decrease your symptoms of the concussion (no reading, watching TV, running, driving, or exercising in general). Return to activity slowly Please treat your scratches with antibiotic ointment as needed

## 2013-09-19 NOTE — ED Notes (Signed)
Pt  Reports    She  [address]     This am  While  Running  And sustined   Some  Abrasions  To  Face  As  Well  As  An injury  To  her  r hand        She  Has  Pain  When  She  Closes  Her  Hand  As  Well  As  Pain on  Palpation       She  Did  Not  Black out  And  Is      Awake  And  Alert      Oriented

## 2014-01-25 ENCOUNTER — Other Ambulatory Visit: Payer: Self-pay

## 2014-01-25 ENCOUNTER — Other Ambulatory Visit: Payer: Self-pay | Admitting: Obstetrics and Gynecology

## 2014-01-25 ENCOUNTER — Other Ambulatory Visit (HOSPITAL_COMMUNITY)
Admission: RE | Admit: 2014-01-25 | Discharge: 2014-01-25 | Disposition: A | Discharge status: Home / Self Care | Payer: 59 | Source: Ambulatory Visit | Attending: Obstetrics and Gynecology | Admitting: Obstetrics and Gynecology

## 2014-01-25 DIAGNOSIS — Z01419 Encounter for gynecological examination (general) (routine) without abnormal findings: Secondary | ICD-10-CM | POA: Insufficient documentation

## 2014-01-25 DIAGNOSIS — Z1231 Encounter for screening mammogram for malignant neoplasm of breast: Secondary | ICD-10-CM

## 2014-01-26 ENCOUNTER — Other Ambulatory Visit: Payer: Self-pay | Admitting: Internal Medicine

## 2014-01-26 DIAGNOSIS — E042 Nontoxic multinodular goiter: Secondary | ICD-10-CM

## 2014-01-26 LAB — CYTOLOGY - PAP

## 2014-02-10 ENCOUNTER — Ambulatory Visit
Admission: RE | Admit: 2014-02-10 | Discharge: 2014-02-10 | Disposition: A | Discharge status: Home / Self Care | Payer: 59 | Source: Ambulatory Visit

## 2014-02-10 DIAGNOSIS — Z1231 Encounter for screening mammogram for malignant neoplasm of breast: Secondary | ICD-10-CM

## 2014-02-15 ENCOUNTER — Ambulatory Visit
Admission: RE | Admit: 2014-02-15 | Discharge: 2014-02-15 | Disposition: A | Discharge status: Home / Self Care | Payer: 59 | Source: Ambulatory Visit | Attending: Internal Medicine | Admitting: Internal Medicine

## 2014-02-15 DIAGNOSIS — E042 Nontoxic multinodular goiter: Secondary | ICD-10-CM

## 2015-01-24 MED FILL — GAVILYTE-N SOLUTION: 420 | 1 days supply | Qty: 4000 | Fill #0

## 2015-01-31 ENCOUNTER — Other Ambulatory Visit (HOSPITAL_COMMUNITY)
Admission: RE | Admit: 2015-01-31 | Discharge: 2015-01-31 | Disposition: A | Discharge status: Home / Self Care | Payer: 59 | Source: Ambulatory Visit | Attending: Obstetrics and Gynecology | Admitting: Obstetrics and Gynecology

## 2015-01-31 ENCOUNTER — Other Ambulatory Visit: Payer: Self-pay | Admitting: Obstetrics and Gynecology

## 2015-01-31 DIAGNOSIS — N952 Postmenopausal atrophic vaginitis: Secondary | ICD-10-CM | POA: Diagnosis not present

## 2015-01-31 DIAGNOSIS — Z01419 Encounter for gynecological examination (general) (routine) without abnormal findings: Secondary | ICD-10-CM | POA: Diagnosis not present

## 2015-01-31 DIAGNOSIS — Z01411 Encounter for gynecological examination (general) (routine) with abnormal findings: Secondary | ICD-10-CM | POA: Diagnosis not present

## 2015-01-31 DIAGNOSIS — Z1151 Encounter for screening for human papillomavirus (HPV): Secondary | ICD-10-CM | POA: Diagnosis not present

## 2015-02-02 ENCOUNTER — Other Ambulatory Visit: Payer: Self-pay | Admitting: Gastroenterology

## 2015-02-02 DIAGNOSIS — D126 Benign neoplasm of colon, unspecified: Secondary | ICD-10-CM | POA: Diagnosis not present

## 2015-02-02 DIAGNOSIS — D125 Benign neoplasm of sigmoid colon: Secondary | ICD-10-CM | POA: Diagnosis not present

## 2015-02-02 DIAGNOSIS — K5730 Diverticulosis of large intestine without perforation or abscess without bleeding: Secondary | ICD-10-CM | POA: Diagnosis not present

## 2015-02-02 DIAGNOSIS — Z1211 Encounter for screening for malignant neoplasm of colon: Secondary | ICD-10-CM | POA: Diagnosis not present

## 2015-02-02 LAB — CYTOLOGY - PAP

## 2015-02-08 DIAGNOSIS — Z0000 Encounter for general adult medical examination without abnormal findings: Secondary | ICD-10-CM | POA: Diagnosis not present

## 2015-02-08 MED FILL — CELECOXIB 200 MG CAPSULE: 200 | 90 days supply | Qty: 90 | Fill #0

## 2015-02-17 ENCOUNTER — Other Ambulatory Visit: Payer: Self-pay

## 2015-02-17 DIAGNOSIS — Z1231 Encounter for screening mammogram for malignant neoplasm of breast: Secondary | ICD-10-CM

## 2015-02-21 DIAGNOSIS — M1990 Unspecified osteoarthritis, unspecified site: Secondary | ICD-10-CM | POA: Diagnosis not present

## 2015-02-21 DIAGNOSIS — E7800 Pure hypercholesterolemia, unspecified: Secondary | ICD-10-CM | POA: Diagnosis not present

## 2015-02-21 DIAGNOSIS — E039 Hypothyroidism, unspecified: Secondary | ICD-10-CM | POA: Diagnosis not present

## 2015-02-21 DIAGNOSIS — M810 Age-related osteoporosis without current pathological fracture: Secondary | ICD-10-CM | POA: Diagnosis not present

## 2015-02-23 DIAGNOSIS — E042 Nontoxic multinodular goiter: Secondary | ICD-10-CM | POA: Diagnosis not present

## 2015-02-23 DIAGNOSIS — E785 Hyperlipidemia, unspecified: Secondary | ICD-10-CM | POA: Diagnosis not present

## 2015-02-23 DIAGNOSIS — M810 Age-related osteoporosis without current pathological fracture: Secondary | ICD-10-CM | POA: Diagnosis not present

## 2015-02-23 DIAGNOSIS — E039 Hypothyroidism, unspecified: Secondary | ICD-10-CM | POA: Diagnosis not present

## 2015-02-25 MED FILL — SYNTHROID 75 MCG TABLET: 75 | 90 days supply | Qty: 90 | Fill #0

## 2015-02-28 DIAGNOSIS — D485 Neoplasm of uncertain behavior of skin: Secondary | ICD-10-CM | POA: Diagnosis not present

## 2015-02-28 DIAGNOSIS — L84 Corns and callosities: Secondary | ICD-10-CM | POA: Diagnosis not present

## 2015-02-28 DIAGNOSIS — D224 Melanocytic nevi of scalp and neck: Secondary | ICD-10-CM | POA: Diagnosis not present

## 2015-03-03 ENCOUNTER — Ambulatory Visit
Admission: RE | Admit: 2015-03-03 | Discharge: 2015-03-03 | Disposition: A | Discharge status: Home / Self Care | Payer: 59 | Source: Ambulatory Visit

## 2015-03-03 DIAGNOSIS — Z1231 Encounter for screening mammogram for malignant neoplasm of breast: Secondary | ICD-10-CM

## 2015-03-21 DIAGNOSIS — D485 Neoplasm of uncertain behavior of skin: Secondary | ICD-10-CM | POA: Diagnosis not present

## 2015-03-21 DIAGNOSIS — Z872 Personal history of diseases of the skin and subcutaneous tissue: Secondary | ICD-10-CM | POA: Diagnosis not present

## 2015-03-21 DIAGNOSIS — L84 Corns and callosities: Secondary | ICD-10-CM | POA: Diagnosis not present

## 2015-03-21 DIAGNOSIS — D225 Melanocytic nevi of trunk: Secondary | ICD-10-CM | POA: Diagnosis not present

## 2015-03-21 DIAGNOSIS — L821 Other seborrheic keratosis: Secondary | ICD-10-CM | POA: Diagnosis not present

## 2015-03-28 ENCOUNTER — Encounter: Payer: Self-pay | Admitting: Podiatry

## 2015-03-28 ENCOUNTER — Ambulatory Visit (INDEPENDENT_AMBULATORY_CARE_PROVIDER_SITE_OTHER): Payer: 59

## 2015-03-28 ENCOUNTER — Ambulatory Visit (INDEPENDENT_AMBULATORY_CARE_PROVIDER_SITE_OTHER): Payer: 59 | Admitting: Podiatry

## 2015-03-28 VITALS — BP 133/85 | HR 74 | Resp 16 | Ht 65.0 in | Wt 170.0 lb

## 2015-03-28 DIAGNOSIS — M79671 Pain in right foot: Secondary | ICD-10-CM | POA: Diagnosis not present

## 2015-03-28 DIAGNOSIS — M779 Enthesopathy, unspecified: Secondary | ICD-10-CM | POA: Diagnosis not present

## 2015-03-28 DIAGNOSIS — M205X1 Other deformities of toe(s) (acquired), right foot: Secondary | ICD-10-CM | POA: Diagnosis not present

## 2015-03-28 DIAGNOSIS — M2041 Other hammer toe(s) (acquired), right foot: Secondary | ICD-10-CM

## 2015-03-28 MED ORDER — TRIAMCINOLONE ACETONIDE 10 MG/ML IJ SUSP
10.0000 mg | Freq: Once | INTRAMUSCULAR | Status: AC
  Administered 2015-03-28: 10 mg

## 2015-03-28 NOTE — Progress Notes (Signed)
   Subjective:    Patient ID: Lauren [place], female    DOB: [birth year], 60 y.o.   MRN: [id number]  HPI Patient presents with corns on their right foot; 2nd toe-both sides; x1 month   Review of Systems  Musculoskeletal: Positive for arthralgias.  All other systems reviewed and are negative.      Objective:   Physical Exam        Assessment & Plan:

## 2015-03-29 NOTE — Progress Notes (Signed)
Subjective:     Patient ID: Lauren [place], female   DOB: October 25, 1955, 60 y.o.   MRN: [id number]  HPI patient states she's having a lot of pain between the big toe second toe right with inflammation occurring against the toe and she is getting ready to go out of town and wants to see if we can help her with this condition   Review of Systems  All other systems reviewed and are negative.      Objective:   Physical Exam  Constitutional: She is oriented to person, place, and time.  Cardiovascular: Intact distal pulses.   Musculoskeletal: Normal range of motion.  Neurological: She is oriented to person, place, and time.  Skin: Skin is warm.  Nursing note and vitals reviewed.  neurovascular status intact muscle strength adequate range of motion within normal limits with patient noted to have severe limitation of motion first MPJ right with bunion deformity left. There is inflammation between the hallux and second toe and there is inflammatory fluid buildup around the interphalangeal joint right second toe where the hallux is pressed against it. 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 At this point I recommended careful interphalangeal joint injection and it was administered with 1 mg Dexon some 1 mg Kenalog debridement of lesions accomplished and padding was dispensed. I discussed ultimate correction of hallux limitus condition and possible arthroplasty digit 2 right depending on response  X-ray report indicated there is severe arthritis around the first MPJ right with complete distraction of the joint and lateral deviation of the hallux against second toe

## 2015-06-03 MED FILL — CELECOXIB 200 MG CAPSULE: 200 | 90 days supply | Qty: 90 | Fill #1

## 2015-06-24 MED FILL — SYNTHROID 75 MCG TABLET: 75 | 90 days supply | Qty: 90 | Fill #1

## 2015-10-10 MED FILL — SYNTHROID 75 MCG TABLET: 75 | 90 days supply | Qty: 90 | Fill #2

## 2015-11-17 MED FILL — CELECOXIB 200 MG CAPSULE: 200 | 90 days supply | Qty: 90 | Fill #2

## 2015-11-28 DIAGNOSIS — R140 Abdominal distension (gaseous): Secondary | ICD-10-CM | POA: Diagnosis not present

## 2015-11-28 DIAGNOSIS — R143 Flatulence: Secondary | ICD-10-CM | POA: Diagnosis not present

## 2015-11-28 DIAGNOSIS — R198 Other specified symptoms and signs involving the digestive system and abdomen: Secondary | ICD-10-CM | POA: Diagnosis not present

## 2016-01-20 MED FILL — SYNTHROID 75 MCG TABLET: 75 | 90 days supply | Qty: 90 | Fill #3

## 2016-02-07 DIAGNOSIS — I10 Essential (primary) hypertension: Secondary | ICD-10-CM | POA: Diagnosis not present

## 2016-02-28 DIAGNOSIS — E039 Hypothyroidism, unspecified: Secondary | ICD-10-CM | POA: Diagnosis not present

## 2016-02-28 DIAGNOSIS — M810 Age-related osteoporosis without current pathological fracture: Secondary | ICD-10-CM | POA: Diagnosis not present

## 2016-02-29 DIAGNOSIS — Z0000 Encounter for general adult medical examination without abnormal findings: Secondary | ICD-10-CM | POA: Diagnosis not present

## 2016-02-29 MED FILL — LISINOPRIL 5 MG TABLET: 5 | 90 days supply | Qty: 90 | Fill #0

## 2016-03-01 ENCOUNTER — Other Ambulatory Visit: Payer: Self-pay | Admitting: Obstetrics and Gynecology

## 2016-03-01 ENCOUNTER — Other Ambulatory Visit (HOSPITAL_COMMUNITY)
Admission: RE | Admit: 2016-03-01 | Discharge: 2016-03-01 | Disposition: A | Discharge status: Home / Self Care | Payer: 59 | Source: Ambulatory Visit | Attending: Obstetrics and Gynecology | Admitting: Obstetrics and Gynecology

## 2016-03-01 DIAGNOSIS — Z01419 Encounter for gynecological examination (general) (routine) without abnormal findings: Secondary | ICD-10-CM | POA: Insufficient documentation

## 2016-03-01 DIAGNOSIS — Z1151 Encounter for screening for human papillomavirus (HPV): Secondary | ICD-10-CM | POA: Insufficient documentation

## 2016-03-01 DIAGNOSIS — N952 Postmenopausal atrophic vaginitis: Secondary | ICD-10-CM | POA: Diagnosis not present

## 2016-03-01 DIAGNOSIS — E785 Hyperlipidemia, unspecified: Secondary | ICD-10-CM | POA: Diagnosis not present

## 2016-03-01 DIAGNOSIS — Z9189 Other specified personal risk factors, not elsewhere classified: Secondary | ICD-10-CM | POA: Diagnosis not present

## 2016-03-01 DIAGNOSIS — M810 Age-related osteoporosis without current pathological fracture: Secondary | ICD-10-CM | POA: Diagnosis not present

## 2016-03-01 DIAGNOSIS — E039 Hypothyroidism, unspecified: Secondary | ICD-10-CM | POA: Diagnosis not present

## 2016-03-01 DIAGNOSIS — E042 Nontoxic multinodular goiter: Secondary | ICD-10-CM | POA: Diagnosis not present

## 2016-03-06 LAB — CYTOLOGY - PAP
Diagnosis: NEGATIVE
HPV: NOT DETECTED

## 2016-03-29 DIAGNOSIS — L821 Other seborrheic keratosis: Secondary | ICD-10-CM | POA: Diagnosis not present

## 2016-03-29 DIAGNOSIS — L281 Prurigo nodularis: Secondary | ICD-10-CM | POA: Diagnosis not present

## 2016-03-29 DIAGNOSIS — D225 Melanocytic nevi of trunk: Secondary | ICD-10-CM | POA: Diagnosis not present

## 2016-03-29 DIAGNOSIS — L814 Other melanin hyperpigmentation: Secondary | ICD-10-CM | POA: Diagnosis not present

## 2016-03-29 DIAGNOSIS — D1801 Hemangioma of skin and subcutaneous tissue: Secondary | ICD-10-CM | POA: Diagnosis not present

## 2016-04-05 DIAGNOSIS — M810 Age-related osteoporosis without current pathological fracture: Secondary | ICD-10-CM | POA: Diagnosis not present

## 2016-05-11 MED FILL — SYNTHROID 75 MCG TABLET: 75 | 90 days supply | Qty: 90 | Fill #0

## 2016-05-16 ENCOUNTER — Other Ambulatory Visit: Payer: Self-pay | Admitting: Obstetrics and Gynecology

## 2016-05-16 DIAGNOSIS — Z1231 Encounter for screening mammogram for malignant neoplasm of breast: Secondary | ICD-10-CM

## 2016-05-30 ENCOUNTER — Ambulatory Visit: Payer: 59

## 2016-05-31 ENCOUNTER — Ambulatory Visit
Admission: RE | Admit: 2016-05-31 | Discharge: 2016-05-31 | Disposition: A | Discharge status: Home / Self Care | Payer: 59 | Source: Ambulatory Visit | Attending: Obstetrics and Gynecology | Admitting: Obstetrics and Gynecology

## 2016-05-31 DIAGNOSIS — Z1231 Encounter for screening mammogram for malignant neoplasm of breast: Secondary | ICD-10-CM

## 2016-06-05 ENCOUNTER — Other Ambulatory Visit: Payer: Self-pay | Admitting: Obstetrics and Gynecology

## 2016-06-05 DIAGNOSIS — R928 Other abnormal and inconclusive findings on diagnostic imaging of breast: Secondary | ICD-10-CM

## 2016-06-06 ENCOUNTER — Ambulatory Visit
Admission: RE | Admit: 2016-06-06 | Discharge: 2016-06-06 | Disposition: A | Discharge status: Home / Self Care | Payer: 59 | Source: Ambulatory Visit | Attending: Obstetrics and Gynecology | Admitting: Obstetrics and Gynecology

## 2016-06-06 DIAGNOSIS — R928 Other abnormal and inconclusive findings on diagnostic imaging of breast: Secondary | ICD-10-CM

## 2016-06-06 DIAGNOSIS — N6489 Other specified disorders of breast: Secondary | ICD-10-CM | POA: Diagnosis not present

## 2016-06-06 DIAGNOSIS — R922 Inconclusive mammogram: Secondary | ICD-10-CM | POA: Diagnosis not present

## 2016-06-07 ENCOUNTER — Inpatient Hospital Stay
Admission: RE | Admit: 2016-06-07 | Discharge: 2016-06-07 | Disposition: A | Discharge status: Home / Self Care | Payer: 59 | Source: Ambulatory Visit | Attending: Obstetrics and Gynecology | Admitting: Obstetrics and Gynecology

## 2016-06-07 ENCOUNTER — Other Ambulatory Visit: Payer: 59

## 2016-06-07 DIAGNOSIS — S0181XA Laceration without foreign body of other part of head, initial encounter: Secondary | ICD-10-CM | POA: Diagnosis not present

## 2016-06-07 DIAGNOSIS — W19XXXA Unspecified fall, initial encounter: Secondary | ICD-10-CM | POA: Diagnosis not present

## 2016-06-07 DIAGNOSIS — Y9302 Activity, running: Secondary | ICD-10-CM | POA: Diagnosis not present

## 2016-06-07 DIAGNOSIS — S00531A Contusion of lip, initial encounter: Secondary | ICD-10-CM | POA: Diagnosis not present

## 2016-06-15 DIAGNOSIS — H5213 Myopia, bilateral: Secondary | ICD-10-CM | POA: Diagnosis not present

## 2016-06-15 DIAGNOSIS — H52223 Regular astigmatism, bilateral: Secondary | ICD-10-CM | POA: Diagnosis not present

## 2016-06-15 DIAGNOSIS — H524 Presbyopia: Secondary | ICD-10-CM | POA: Diagnosis not present

## 2016-06-22 DIAGNOSIS — M1711 Unilateral primary osteoarthritis, right knee: Secondary | ICD-10-CM | POA: Diagnosis not present

## 2016-07-06 DIAGNOSIS — J018 Other acute sinusitis: Secondary | ICD-10-CM | POA: Diagnosis not present

## 2016-07-16 DIAGNOSIS — M1711 Unilateral primary osteoarthritis, right knee: Secondary | ICD-10-CM | POA: Diagnosis not present

## 2016-07-19 ENCOUNTER — Ambulatory Visit: Payer: 59 | Attending: Orthopaedic Surgery

## 2016-07-19 DIAGNOSIS — R262 Difficulty in walking, not elsewhere classified: Secondary | ICD-10-CM | POA: Diagnosis not present

## 2016-07-19 DIAGNOSIS — M25661 Stiffness of right knee, not elsewhere classified: Secondary | ICD-10-CM | POA: Diagnosis not present

## 2016-07-19 DIAGNOSIS — M1711 Unilateral primary osteoarthritis, right knee: Secondary | ICD-10-CM | POA: Diagnosis not present

## 2016-07-19 DIAGNOSIS — M222X1 Patellofemoral disorders, right knee: Secondary | ICD-10-CM | POA: Insufficient documentation

## 2016-07-19 NOTE — Patient Instructions (Signed)

## 2016-07-19 NOTE — Therapy (Signed)
[place], [place], [address] Phone: [phone]   Fax:  [phone]  Physical Therapy Evaluation  Patient Details  Name: Lauren [place] MRN: [id number] Date of Birth: Aug 16, 1955 Referring Provider: Melrose [place] ,MD  Encounter Date: 07/19/2016      PT End of Session - 07/19/16 1631    Visit Number 1   Number of Visits 12   Date for PT Re-Evaluation 08/31/16   Authorization Type [facility]   PT Start Time 0430   PT Stop Time 0515   PT Time Calculation (min) 45 min   Activity Tolerance Patient tolerated treatment well   Behavior During Therapy [facility] for tasks assessed/performed      Past Medical History:  Diagnosis Date  . Hypertension   . Thyroid disease     Past Surgical History:  Procedure Laterality Date  . CESAREAN SECTION      There were no vitals filed for this visit.       Subjective Assessment - 07/19/16 1640    Subjective She report PFS  with DJD,  and bakers cyst.   She received injection with benefit that did not last. She was runningwhen she began to have pain with severe pain .  Patella has shifted .       Limitations Standing;Walking;House hold activities  not running ( was able to run 5 miles) , work out at gym, rising for chair   Patient Stated Goals REturn to normal activity, gym  running   Currently in Pain? Yes   Pain Score 6    Pain Location Knee   Pain Orientation Right;Medial;Lateral;Anterior   Pain Descriptors / Indicators Aching;Burning   Pain Type Acute pain   Pain Radiating Towards to RT thigh and lower leg at times   Pain Onset 1 to 4 weeks ago   Pain Frequency Constant   Aggravating Factors  Weight bearing, sitting   Pain Relieving Factors Nothing,             [facility] PT Assessment - 07/19/16 0001      Assessment   Medical Diagnosis Knee pain   Referring Provider [place] ,MD   Onset Date/Surgical Date 07/02/16   Next MD Visit As needed   Prior Therapy No     Precautions   Precautions None     Restrictions   Weight Bearing Restrictions No     Balance Screen   Has the patient fallen in the past 6 months Yes   How many times? 1  tripped   Has the patient had a decrease in activity level because of a fear of falling?  No   Is the patient reluctant to leave their home because of a fear of falling?  No     Prior Function   Level of Independence Independent     Cognition   Overall Cognitive Status Within Functional Limits for tasks assessed     Observation/Other Assessments   Focus on Therapeutic Outcomes (FOTO)  68% limited     ROM / Strength   AROM / PROM / Strength AROM;Strength     AROM   AROM Assessment Site Knee   Right/Left Knee Right;Left   Right Knee Extension 0   Right Knee Flexion 120  prone 110   Left Knee Extension 0   Left Knee Flexion 135  115     Strength   Overall Strength Comments Hip strength also 5/5   Strength Assessment Site Knee  Right/Left Knee Right;Left   Right Knee Flexion 5/5   Right Knee Extension 5/5   Left Knee Flexion 5/5   Left Knee Extension 5/5     Flexibility   Soft Tissue Assessment /Muscle Length yes   Hamstrings 70 degrees bilateally     Palpation   Patella mobility WNL with mild tenderness   Palpation comment moderate tenderness medial joint line     Ambulation/Gait   Gait Comments Mild antalgic gait , no device            Objective measurements completed on examination: See above findings.          [facility] Adult PT Treatment/Exercise - 07/19/16 0001      Modalities   Modalities Iontophoresis;Ultrasound;Cryotherapy     Iontophoresis   Type of Iontophoresis Dexamethasone   Location medial RT knee   Dose 1cc   Time 4-6 hours                PT Education - 07/19/16 1632    Education provided Yes   Education Details POC  , ionto patch management, issued red band, suggested ice 10-15 min 2-4x/day   Person(s) Educated Patient    Methods Explanation;Handout   Comprehension Verbalized understanding          PT Short Term Goals - 07/19/16 1734      PT SHORT TERM GOAL #1   Title She will be independent with inital HEP   Time 3   Period Weeks   Status New     PT SHORT TERM GOAL #2   Title She will report pain decreased 30% or more    Time 3   Period Weeks   Status New     PT SHORT TERM GOAL #3   Title RT knee active flexion to 130 degrees   Time 3   Period Weeks   Status New           PT Long Term Goals - 07/19/16 1630      PT LONG TERM GOAL #1   Title She will be independent with all hEP issued   Time 6   Period Weeks   Status New     PT LONG TERM GOAL #2   Title She will be able to walk with 1-2/10 max pain   Time 6   Period Weeks   Status New     PT LONG TERM GOAL #3   Title ROM equal Lt knee    Time 6   Period Weeks   Status New     PT LONG TERM GOAL #4   Title return to gym for aerobic exercise 20-30 min on bike /treadmill or machine that does not incr pain.    Time 6   Period Weeks     PT LONG TERM GOAL #5   Title FOTO score improved to 44% or bette to demo functional improvement   Time 6   Period Weeks   Status New                Plan - 07/19/16 1631    Clinical Impression Statement [name] presents with knee pain limiting activity on feet. Mild antalgic gait.  Her strength is normal but with mild limitation in flexion ROM.   Pain mostly medical along joint line. Tender to touch medial RT knee.      Clinical Presentation Stable   Clinical Decision Making Low   Rehab Potential Good   PT Frequency 2x /  week   PT Duration 6 weeks   PT Treatment/Interventions Electrical Stimulation;Iontophoresis 4mg /ml Dexamethasone;Ultrasound;Passive range of motion;Patient/family education;Therapeutic exercise;Therapeutic activities;Manual techniques   PT Next Visit Plan review and  advance HEP if needed. She is doing HEP from MD and looks appropriate, modalities as  needed  [place] and ionto if no irritation  after today   PT Home Exercise Plan QS , SLR supin/side lye, clams   Consulted and Agree with Plan of Care Patient      Patient will benefit from skilled therapeutic intervention in order to improve the following deficits and impairments:  Decreased activity tolerance, Decreased strength, Pain, Decreased range of motion, Difficulty walking  Visit Diagnosis: Osteoarthritis of right knee, unspecified osteoarthritis type  Patellofemoral syndrome of right knee  Stiffness of right knee, not elsewhere classified  Difficulty in walking, not elsewhere classified     Problem List Patient Active Problem List   Diagnosis Date Noted  . Left hip pain 11/17/2012    Darrel Hoover  PT 07/19/2016, 5:42 PM  [facility] [address] [place], [place], [address] Phone: [phone]   Fax:  [phone]  Name: Lauren [place] MRN: [id number] Date of Birth: 09-11-1955

## 2016-07-25 ENCOUNTER — Ambulatory Visit: Payer: 59 | Admitting: Rehabilitative and Restorative Service Providers"

## 2016-07-25 DIAGNOSIS — M25661 Stiffness of right knee, not elsewhere classified: Secondary | ICD-10-CM

## 2016-07-25 DIAGNOSIS — M1711 Unilateral primary osteoarthritis, right knee: Secondary | ICD-10-CM

## 2016-07-25 DIAGNOSIS — R262 Difficulty in walking, not elsewhere classified: Secondary | ICD-10-CM

## 2016-07-25 DIAGNOSIS — M222X1 Patellofemoral disorders, right knee: Secondary | ICD-10-CM | POA: Diagnosis not present

## 2016-07-25 NOTE — Therapy (Signed)
[place], [place], [address] Phone: [phone]   Fax:  [phone]  Physical Therapy Treatment  Patient Details  Name: Lauren [place] MRN: [id number] Date of Birth: 12/03/1955 Referring Provider: Melrose Nakayama ,MD  Encounter Date: 07/25/2016      PT End of Session - 07/25/16 1149    Visit Number 2   Number of Visits 12   Date for PT Re-Evaluation 08/31/16   Authorization Type [facility]   PT Start Time 0800   PT Stop Time 0850   PT Time Calculation (min) 50 min   Activity Tolerance Patient tolerated treatment well;No increased pain   Behavior During Therapy WFL for tasks assessed/performed      Past Medical History:  Diagnosis Date  . Hypertension   . Thyroid disease     Past Surgical History:  Procedure Laterality Date  . CESAREAN SECTION      There were no vitals filed for this visit.      Subjective Assessment - 07/25/16 1138    Subjective Pt reports no change; really wants to run.   Limitations Standing;Walking;House hold activities   Patient Stated Goals REturn to normal activity, gym  running   Currently in Pain? Yes   Pain Score 6    Pain Location Knee   Pain Orientation Right;Medial   Pain Descriptors / Indicators Aching   Pain Type Acute pain   Pain Onset 1 to 4 weeks ago   Pain Frequency Constant   Aggravating Factors  transitioning from STS   Pain Relieving Factors nothing            [facility] PT Assessment - 07/25/16 0001      Posture/Postural Control   Posture Comments drags L LE with walking and light jog with HOKAs on     Strength   Overall Strength Comments R VMO 3+/5, R hip addct 3+     Palpation   Palpation comment R ASIS/knee lower     Special Tests    Special Tests Knee Special Tests   Knee Special tests  other     other    Comments R Varus/Valgus/Pivot Shift - but tender along MCL                     [facility] Adult PT  Treatment/Exercise - 07/25/16 0001      Exercises   Exercises Knee/Hip     Knee/Hip Exercises: Supine   Other Supine Knee/Hip Exercises R quad set x 10, R SLR x 10, R VMO SLR x 20 (issued also for HEP), R hip adduction in sidelying x 20, prone R hip extension x 20, R donkey kick x 20     Iontophoresis   Type of Iontophoresis Dexamethasone   Location medial R knee   Dose 1 cc   Time 4-6 hours                  PT Short Term Goals - 07/25/16 1152      PT SHORT TERM GOAL #1   Title She will be independent with inital HEP   Time 3   Period Weeks   Status On-going     PT SHORT TERM GOAL #2   Title She will report pain decreased 30% or more    Time 3   Period Weeks   Status On-going     PT SHORT TERM GOAL #3   Title RT knee active flexion to 130  degrees   Time 3   Period Weeks   Status On-going           PT Long Term Goals - 07/19/16 1630      PT LONG TERM GOAL #1   Title She will be independent with all hEP issued   Time 6   Period Weeks   Status New     PT LONG TERM GOAL #2   Title She will be able to walk with 1-2/10 max pain   Time 6   Period Weeks   Status New     PT LONG TERM GOAL #3   Title ROM equal Lt knee    Time 6   Period Weeks   Status New     PT LONG TERM GOAL #4   Title return to gym for aerobic exercise 20-30 min on bike /treadmill or machine that does not incr pain.    Time 6   Period Weeks     PT LONG TERM GOAL #5   Title FOTO score improved to 44% or bette to demo functional improvement   Time 6   Period Weeks   Status New               Plan - 07/25/16 1149    Clinical Impression Statement Pt presents to PT for 2nd PT visit for R knee pain. Pt with over stress to R MCL/medial knee with weakness of R VMO and R hip adductors. Pt would benefit from further PT for VMO/adductor strengthening R and pain management. See if she scheduled with Dr. Oneida Alar. See pt instruction section.   Rehab Potential Good   PT Frequency  2x / week   PT Duration 6 weeks   PT Next Visit Plan review VMO SLR, continue R LE pain management and strengthening with emphasis on VMO and adductors, continue ionto #3   PT Home Exercise Plan QS , SLR supin/side lye, clams, VMO SLR   Consulted and Agree with Plan of Care Patient      Patient will benefit from skilled therapeutic intervention in order to improve the following deficits and impairments:  Decreased activity tolerance, Decreased strength, Pain, Decreased range of motion, Difficulty walking  Visit Diagnosis: Osteoarthritis of right knee, unspecified osteoarthritis type  Stiffness of right knee, not elsewhere classified  Difficulty in walking, not elsewhere classified     Problem List Patient Active Problem List   Diagnosis Date Noted  . Left hip pain 11/17/2012    Myra Rude, PT 07/25/2016, 11:54 AM  [facility] [address] [place], [place], [address] Phone: [phone]   Fax:  [phone]  Name: MARI BATTAGLIA MRN: [id number] Date of Birth: 1955/02/12

## 2016-07-25 NOTE — Patient Instructions (Signed)
Discussed results of special testing all being - but pt tender along MCL with popping and pain along MCL with STS transfer and after pop, pain relieves. With initial transfer, pt noted to have R increased valgas further stressing MCL with education on keeping knee in line with hip with transfer; same with gait and jogging assessment. Pt not to jog until further notice. Discussed seeing Dr. Oneida Alar for diagnostic US to [facility] to see if tear due to pt having increased R knee pain initially from running with new shoes and feeling a pop along MCL.

## 2016-07-30 ENCOUNTER — Ambulatory Visit: Payer: 59

## 2016-07-30 DIAGNOSIS — M1711 Unilateral primary osteoarthritis, right knee: Secondary | ICD-10-CM

## 2016-07-30 DIAGNOSIS — R262 Difficulty in walking, not elsewhere classified: Secondary | ICD-10-CM | POA: Diagnosis not present

## 2016-07-30 DIAGNOSIS — M25661 Stiffness of right knee, not elsewhere classified: Secondary | ICD-10-CM

## 2016-07-30 DIAGNOSIS — M222X1 Patellofemoral disorders, right knee: Secondary | ICD-10-CM

## 2016-07-30 NOTE — Therapy (Signed)
[place], [place], [address] Phone: [phone]   Fax:  [phone]  Physical Therapy Treatment  Patient Details  Name: Lauren Yates MRN: [id number] Date of Birth: 1955-02-13 Referring Provider: Melrose Nakayama ,MD  Encounter Date: 07/30/2016      PT End of Session - 07/30/16 1550    Visit Number 3   Number of Visits 12   Date for PT Re-Evaluation 08/31/16   Authorization Type [facility]   PT Start Time 6962   PT Stop Time 0435   PT Time Calculation (min) 48 min   Activity Tolerance Patient tolerated treatment well;No increased pain   Behavior During Therapy WFL for tasks assessed/performed      Past Medical History:  Diagnosis Date  . Hypertension   . Thyroid disease     Past Surgical History:  Procedure Laterality Date  . CESAREAN SECTION      There were no vitals filed for this visit.      Subjective Assessment - 07/30/16 1550    Subjective Increased pain today. Did not use bike yester day due to gym closed.  Walked. Feels better post recumbant bike   Pain Score 7    Pain Location Knee   Pain Orientation Right;Medial   Pain Descriptors / Indicators Aching   Pain Type Acute pain   Pain Onset 1 to 4 weeks ago   Pain Frequency Constant   Aggravating Factors  getting out of chair   Multiple Pain Sites No                         [facility] Adult PT Treatment/Exercise - 07/30/16 0001      Knee/Hip Exercises: Supine   Other Supine Knee/Hip Exercises RT QSx 5 then x 12 with SLR with ER 30 degrees then side lye abduct x 15 10 with Ir /ER toe to heel touch then clam x 12 in 3 positions with verbal and tactile cues and issued for HEP, then prone hip extension wih RT lift lower than Lt soe stretched and mobs into PA with prone leg figure 4 position with increased range with RT hip SLR    Other Supine Knee/Hip Exercises Then wll sits to 80 degree swithout incr pain 5 sec x 15  reps.      Iontophoresis   Type of Iontophoresis Dexamethasone   Location medial R knee   Dose 1 cc   Time 4-6 hours     Recumbent bike L3 6 min no incr pain.              PT Short Term Goals - 07/30/16 1704      PT SHORT TERM GOAL #1   Title She will be independent with inital HEP   Status Achieved     PT SHORT TERM GOAL #2   Title She will report pain decreased 30% or more    Status On-going     PT SHORT TERM GOAL #3   Title RT knee active flexion to 130 degrees   Status Unable to assess           PT Long Term Goals - 07/19/16 1630      PT LONG TERM GOAL #1   Title She will be independent with all hEP issued   Time 6   Period Weeks   Status New     PT LONG TERM GOAL #2   Title She will be able  to walk with 1-2/10 max pain   Time 6   Period Weeks   Status New     PT LONG TERM GOAL #3   Title ROM equal Lt knee    Time 6   Period Weeks   Status New     PT LONG TERM GOAL #4   Title return to gym for aerobic exercise 20-30 min on bike /treadmill or machine that does not incr pain.    Time 6   Period Weeks     PT LONG TERM GOAL #5   Title FOTO score improved to 44% or bette to demo functional improvement   Time 6   Period Weeks   Status New               Plan - 07/30/16 1659    Clinical Impression Statement Increased pain and limp today but she was able to do all exercises without increased pain.   Skin welted medial RT knee and I cautioned about putting ionto patch on this area but she insisted  so will assess skin next visit and if worse will hold ionto next session.    Possible [place] next session  She is weighing her options for diagnosic [place] and weather syn visc for her.    PT Treatment/Interventions Electrical Stimulation;Iontophoresis 4mg /ml Dexamethasone;Ultrasound;Passive range of motion;Patient/family education;Therapeutic exercise;Therapeutic activities;Manual techniques   PT Next Visit Plan continue R LE pain management and  strengthening with emphasis on VMO and adductors, continue ionto #4 assess ROM   PT Home Exercise Plan QS , SLR supin/side lye, clams with variations , VMO SLR, wall sits  to 80 degrees without incr pain    Consulted and Agree with Plan of Care Patient      Patient will benefit from skilled therapeutic intervention in order to improve the following deficits and impairments:  Decreased activity tolerance, Decreased strength, Pain, Decreased range of motion, Difficulty walking  Visit Diagnosis: Osteoarthritis of right knee, unspecified osteoarthritis type  Stiffness of right knee, not elsewhere classified  Difficulty in walking, not elsewhere classified  Patellofemoral syndrome of right knee     Problem List Patient Active Problem List   Diagnosis Date Noted  . Left hip pain 11/17/2012    Darrel Hoover  PT 07/30/2016, 5:05 PM  [facility] [address] [place], [place], [address] Phone: [phone]   Fax:  [phone]  Name: Lauren Yates MRN: [id number] Date of Birth: 17-Oct-1955

## 2016-08-03 ENCOUNTER — Ambulatory Visit: Payer: 59 | Admitting: Rehabilitative and Restorative Service Providers"

## 2016-08-03 DIAGNOSIS — R262 Difficulty in walking, not elsewhere classified: Secondary | ICD-10-CM

## 2016-08-03 DIAGNOSIS — M1711 Unilateral primary osteoarthritis, right knee: Secondary | ICD-10-CM

## 2016-08-03 DIAGNOSIS — M25661 Stiffness of right knee, not elsewhere classified: Secondary | ICD-10-CM

## 2016-08-03 DIAGNOSIS — M222X1 Patellofemoral disorders, right knee: Secondary | ICD-10-CM | POA: Diagnosis not present

## 2016-08-03 NOTE — Therapy (Signed)
[place], [place], [address] Phone: [phone]   Fax:  [phone]  Physical Therapy Treatment  Patient Details  Name: Lauren Yates MRN: [id number] Date of Birth: [birth year] Referring Provider: Melrose Nakayama ,MD  Encounter Date: 08/03/2016      PT End of Session - 08/03/16 0821    Visit Number 4   Number of Visits 12   Date for PT Re-Evaluation 08/31/16   Authorization Type [facility]   PT Start Time 0805   PT Stop Time 0850   PT Time Calculation (min) 45 min   Activity Tolerance Patient tolerated treatment well;No increased pain   Behavior During Therapy WFL for tasks assessed/performed      Past Medical History:  Diagnosis Date  . Hypertension   . Thyroid disease     Past Surgical History:  Procedure Laterality Date  . CESAREAN SECTION      There were no vitals filed for this visit.      Subjective Assessment - 08/03/16 0822    Subjective No pain this morning but yesterday it hurt pretty bad on the inside of my knee. It is getting better though.   Limitations Standing;Walking;House hold activities   How long can you sit comfortably? 30 min   How long can you stand comfortably? 30 min   How long can you walk comfortably? 30-45 min   Patient Stated Goals REturn to normal activity, gym  running   Currently in Pain? No/denies  pt reports pain yesterday to be up to 6/10                         [facility] Adult PT Treatment/Exercise - 08/03/16 0001      Knee/Hip Exercises: Aerobic   Elliptical level 1, incline 1 x 10 min with PT verbal cues and monitoring of keeping hips slightly abducted to facilitate more glute med strengthening     Knee/Hip Exercises: Standing   Other Standing Knee Exercises yellow weighted ball wall slides x 20; plies x 20; isometric weighted ball slide hold 2x5 sec each; isometric plies x 5 x 5 sec; standing R ITB stretch 2x30 sec     Knee/Hip  Exercises: Supine   Other Supine Knee/Hip Exercises R SLR x 20, R VMO SLR x 20, 2 lb VMO SLR x 20, 2 lb SLR x 20; 2 lb hip flex/abdct off of the side of the bed to neutral x 20; 2 lb hip addct to midline x 20     Iontophoresis   Type of Iontophoresis Dexamethasone   Location medial R knee   Dose 1 cc   Time 4-6 hours                PT Education - 08/03/16 0855    Education provided Yes   Education Details answered pt questions regarding sleeping supine with pillow under knees   Person(s) Educated Patient   Methods Explanation   Comprehension Verbalized understanding          PT Short Term Goals - 07/30/16 1704      PT SHORT TERM GOAL #1   Title She will be independent with inital HEP   Status Achieved     PT SHORT TERM GOAL #2   Title She will report pain decreased 30% or more    Status On-going     PT SHORT TERM GOAL #3   Title RT knee active flexion to 130  degrees   Status Unable to assess           PT Long Term Goals - 07/19/16 1630      PT LONG TERM GOAL #1   Title She will be independent with all hEP issued   Time 6   Period Weeks   Status New     PT LONG TERM GOAL #2   Title She will be able to walk with 1-2/10 max pain   Time 6   Period Weeks   Status New     PT LONG TERM GOAL #3   Title ROM equal Lt knee    Time 6   Period Weeks   Status New     PT LONG TERM GOAL #4   Title return to gym for aerobic exercise 20-30 min on bike /treadmill or machine that does not incr pain.    Time 6   Period Weeks     PT LONG TERM GOAL #5   Title FOTO score improved to 44% or bette to demo functional improvement   Time 6   Period Weeks   Status New               Plan - 08/03/16 8185    Clinical Impression Statement Pt presents to PT with reports of improvement of R medial knee pain. Pt noted to have a pocket of swelling medial to patella tendon. No popping or crepitus palpated with movement but pt reports functional popping with  transitional movements throughout the day. She needed mod verbal cues for control of movement and proper muscle activation. Pt would benefit from PT for further R LE strengthening with emphasis on VMo and pain management. Ionto patch applied    Rehab Potential Good   PT Frequency 2x / week   PT Duration 6 weeks   PT Treatment/Interventions Electrical Stimulation;Iontophoresis 4mg /ml Dexamethasone;Ultrasound;Passive range of motion;Patient/family education;Therapeutic exercise;Therapeutic activities;Manual techniques   PT Next Visit Plan continue R LE pain management and strengthening with emphasis on VMO and adductors, continue ionto #5 assess ROM   PT Home Exercise Plan QS , SLR supin/side lye, clams with variations , VMO SLR, wall sits  to 80 degrees without incr pain    Consulted and Agree with Plan of Care Patient      Patient will benefit from skilled therapeutic intervention in order to improve the following deficits and impairments:  Decreased activity tolerance, Decreased strength, Pain, Decreased range of motion, Difficulty walking  Visit Diagnosis: Osteoarthritis of right knee, unspecified osteoarthritis type  Stiffness of right knee, not elsewhere classified  Difficulty in walking, not elsewhere classified  Patellofemoral syndrome of right knee     Problem List Patient Active Problem List   Diagnosis Date Noted  . Left hip pain 11/17/2012    Myra Rude, PT 08/03/2016, 8:59 AM  [facility] [address] [place], [place], [address] Phone: [phone]   Fax:  [phone]  Name: Lauren Yates MRN: [id number] Date of Birth: 1955/02/26

## 2016-08-08 ENCOUNTER — Ambulatory Visit: Payer: 59 | Attending: Orthopaedic Surgery | Admitting: Physical Therapy

## 2016-08-08 ENCOUNTER — Encounter: Payer: Self-pay | Admitting: Physical Therapy

## 2016-08-08 DIAGNOSIS — M222X1 Patellofemoral disorders, right knee: Secondary | ICD-10-CM | POA: Diagnosis not present

## 2016-08-08 DIAGNOSIS — M25661 Stiffness of right knee, not elsewhere classified: Secondary | ICD-10-CM | POA: Diagnosis not present

## 2016-08-08 DIAGNOSIS — R262 Difficulty in walking, not elsewhere classified: Secondary | ICD-10-CM | POA: Insufficient documentation

## 2016-08-08 DIAGNOSIS — M1711 Unilateral primary osteoarthritis, right knee: Secondary | ICD-10-CM | POA: Diagnosis not present

## 2016-08-08 NOTE — Therapy (Signed)
[place], [place], [address] Phone: [phone]   Fax:  [phone]  Physical Therapy Treatment  Patient Details  Name: Lauren Yates MRN: [id number] Date of Birth: 26-Oct-1955 Referring Provider: Melrose Nakayama ,MD  Encounter Date: 08/08/2016      PT End of Session - 08/08/16 0843    Visit Number 5   Number of Visits 12   Date for PT Re-Evaluation 08/31/16   PT Start Time 0740   PT Stop Time 0820   PT Time Calculation (min) 40 min   Activity Tolerance Patient tolerated treatment well   Behavior During Therapy [facility] for tasks assessed/performed      Past Medical History:  Diagnosis Date  . Hypertension   . Thyroid disease     Past Surgical History:  Procedure Laterality Date  . CESAREAN SECTION      There were no vitals filed for this visit.      Subjective Assessment - 08/08/16 0827    Subjective No pain in am.  More in the evening. PT is helping.  I have been able to work out in the gym again,  [employer].   Currently in Pain? No/denies   Pain Score --  up to moderate   Pain Location Knee   Pain Orientation Right;Medial   Pain Descriptors / Indicators Aching;Sore   Pain Type Acute pain   Pain Frequency Intermittent   Aggravating Factors  at the end of the day   Pain Relieving Factors ICE   Multiple Pain Sites --  Some mild right hip and shoulder soreness/ stiffness she relates to scoliosis.                         [facility] Adult PT Treatment/Exercise - 08/08/16 0001      Self-Care   Self-Care Other Self-Care Comments   Other Self-Care Comments  Note written at patient's request to avoid strenous activity., see note.     Knee/Hip Exercises: Stretches   Sports administrator 1 rep;10 seconds   Gastroc Stretch 2 reps;30 seconds   Gastroc Stretch Limitations incline   Soleus Stretch 1 rep;30 seconds   Soleus Stretch Limitations incline    Other Knee/Hip Stretches IT  band type stretch 1  X 45 seconds standing , legs crossed, and 1 X on mat sitting with right knee flexed 10 seconds     Knee/Hip Exercises: Aerobic   Elliptical level 1, incline 1 x 10 min with PT verbal cues ,  warm up 5 minutes     Knee/Hip Exercises: Standing   Heel Raises 20 reps   Heel Raises Limitations Right only   SLS 5-8 X 15 - 20 second holds with right knee flexed  focus on hips level     Knee/Hip Exercises: Seated   Ball Squeeze 10 X 10 seconds,  yellow ball     Knee/Hip Exercises: Supine   Straight Leg Raises 10 reps   Straight Leg Raises Limitations 10 seconde,  Hip neutral   Patellar Mobs checked     Iontophoresis   Type of Iontophoresis Dexamethasone   Location medial R knee   Dose 1cc   Time 4-6 hours.     Manual Therapy   Manual Therapy Edema management   Manual therapy comments educated to elevate as she uses ice and retrograde if swollen   Edema Management thigh, lymph system activation and retrograde,  able to soften congested areas in hip,  hamstrings and quads.                 PT Education - 08/08/16 [phone]    Education provided Yes   Education Details edema control.   Person(s) Educated Patient   Methods Explanation;Demonstration   Comprehension Verbalized understanding          PT Short Term Goals - 08/08/16 0847      PT SHORT TERM GOAL #1   Title She will be independent with inital HEP   Time 3   Period Weeks   Status Achieved     PT SHORT TERM GOAL #2   Title She will report pain decreased 30% or more    Baseline improving, no % given   Time 3   Period Weeks   Status On-going     PT SHORT TERM GOAL #3   Title RT knee active flexion to 130 degrees   Time 3   Period Weeks   Status Unable to assess           PT Long Term Goals - 07/19/16 1630      PT LONG TERM GOAL #1   Title She will be independent with all hEP issued   Time 6   Period Weeks   Status New     PT LONG TERM GOAL #2   Title She will be able to  walk with 1-2/10 max pain   Time 6   Period Weeks   Status New     PT LONG TERM GOAL #3   Title ROM equal Lt knee    Time 6   Period Weeks   Status New     PT LONG TERM GOAL #4   Title return to gym for aerobic exercise 20-30 min on bike /treadmill or machine that does not incr pain.    Time 6   Period Weeks     PT LONG TERM GOAL #5   Title FOTO score improved to 44% or bette to demo functional improvement   Time 6   Period Weeks   Status New               Plan - 08/08/16 4431    Clinical Impression Statement Pain is improving.  Exercise focus today.  She is unable to rest her leg flat on the table (scoliosis?,  hip flex tight?)   PT Treatment/Interventions Electrical Stimulation;Iontophoresis 4mg /ml Dexamethasone;Ultrasound;Passive range of motion;Patient/family education;Therapeutic exercise;Therapeutic activities;Manual techniques   PT Next Visit Plan continue R LE pain management and strengthening with emphasis on VMO and adductors, continue ionto #6  if needed. assess ROM,  consider stretching hip flexer. supine off table with opposite knee flexed   PT Home Exercise Plan QS , SLR supin/side lye, clams with variations , VMO SLR, wall sits  to 80 degrees without incr pain    Consulted and Agree with Plan of Care Patient      Patient will benefit from skilled therapeutic intervention in order to improve the following deficits and impairments:     Visit Diagnosis: Osteoarthritis of right knee, unspecified osteoarthritis type  Stiffness of right knee, not elsewhere classified  Difficulty in walking, not elsewhere classified  Patellofemoral syndrome of right knee     Problem List Patient Active Problem List   Diagnosis Date Noted  . Left hip pain 11/17/2012    Uziel Covault PTA 08/08/2016, 8:50 AM  [facility] [address] [place], [place], [address] Phone: [phone]  Fax:  [phone]  Name: Lauren Yates MRN: [id number] Date of Birth: 1955-09-15

## 2016-08-08 NOTE — Therapy (Signed)
[place], [place], [address] Phone: [phone]   Fax:  [phone]  Patient Details  Name: Lauren Yates MRN: [id number] Date of Birth: Nov 10, 1955 Referring Provider:  Sinda Du, MD  Encounter Date: 08/08/2016   Edwyna Shell is currently a patient in physical therapy. She should avoid stressful physical activity to prevent a delay in her recovery.   HARRIS,KAREN  PTA 08/08/2016, 7:45 AM  [facility] [address] [place], [place], [address] Phone: [phone]   Fax:  [phone]

## 2016-08-10 ENCOUNTER — Ambulatory Visit: Payer: 59 | Admitting: Physical Therapy

## 2016-08-10 DIAGNOSIS — R262 Difficulty in walking, not elsewhere classified: Secondary | ICD-10-CM

## 2016-08-10 DIAGNOSIS — M25661 Stiffness of right knee, not elsewhere classified: Secondary | ICD-10-CM | POA: Diagnosis not present

## 2016-08-10 DIAGNOSIS — M1711 Unilateral primary osteoarthritis, right knee: Secondary | ICD-10-CM

## 2016-08-10 DIAGNOSIS — M222X1 Patellofemoral disorders, right knee: Secondary | ICD-10-CM

## 2016-08-10 NOTE — Therapy (Signed)
[place], [place], [address] Phone: [phone]   Fax:  [phone]  Physical Therapy Treatment  Patient Details  Name: Lauren Yates MRN: [id number] Date of Birth: 09-08-1955 Referring Provider: Melrose Nakayama ,MD  Encounter Date: 08/10/2016      PT End of Session - 08/10/16 0801    Visit Number 6   Number of Visits 12   Date for PT Re-Evaluation 08/31/16   Authorization Type [facility]   PT Start Time 0758   PT Stop Time 0836   PT Time Calculation (min) 38 min      Past Medical History:  Diagnosis Date  . Hypertension   . Thyroid disease     Past Surgical History:  Procedure Laterality Date  . CESAREAN SECTION      There were no vitals filed for this visit.      Subjective Assessment - 08/10/16 0801    Subjective No pain, just sore.    Currently in Pain? No/denies            [facility] PT Assessment - 08/10/16 0001      AROM   Right Knee Flexion 125                     [facility] Adult PT Treatment/Exercise - 08/10/16 0001      Knee/Hip Exercises: Stretches   Sports administrator 1 rep;10 seconds   Quad Stretch Limitations standing with foot in chair to stretch quad    Hip Flexor Stretch 1 rep;60 seconds   Other Knee/Hip Stretches Prone quad stretch with sheet 3 x 30 seconds      Knee/Hip Exercises: Aerobic   Recumbent Bike L2 x 5 minutes      Knee/Hip Exercises: Supine   Quad Sets 20 reps   Short Arc [employer] 10 reps   Short Arc Quad Sets Limitations with ball squeeze    Straight Leg Raise with External Rotation 20 reps   Other Supine Knee/Hip Exercises Prone hip extension x 10 , donkey kicks 10 x 2 , side clam red band x 20      Knee/Hip Exercises: Sidelying   Clams x 20 red band      Iontophoresis   Type of Iontophoresis Dexamethasone   Location medial R knee   Dose 1cc   Time 4-6 hours.                  PT Short Term Goals - 08/08/16 0847       PT SHORT TERM GOAL #1   Title She will be independent with inital HEP   Time 3   Period Weeks   Status Achieved     PT SHORT TERM GOAL #2   Title She will report pain decreased 30% or more    Baseline improving, no % given   Time 3   Period Weeks   Status On-going     PT SHORT TERM GOAL #3   Title RT knee active flexion to 130 degrees   Time 3   Period Weeks   Status Unable to assess           PT Long Term Goals - 07/19/16 1630      PT LONG TERM GOAL #1   Title She will be independent with all hEP issued   Time 6   Period Weeks   Status New     PT LONG TERM GOAL #2   Title  She will be able to walk with 1-2/10 max pain   Time 6   Period Weeks   Status New     PT LONG TERM GOAL #3   Title ROM equal Lt knee    Time 6   Period Weeks   Status New     PT LONG TERM GOAL #4   Title return to gym for aerobic exercise 20-30 min on bike /treadmill or machine that does not incr pain.    Time 6   Period Weeks     PT LONG TERM GOAL #5   Title FOTO score improved to 44% or bette to demo functional improvement   Time 6   Period Weeks   Status New               Plan - 08/10/16 5701    Clinical Impression Statement Knee flexion improved 5 degreees. Instructed pt in prone/standing quad stretch with sheet and hip flexor stretch off edge of mat with knee flexion. Pt requests to be scheduled with primary PT to discuss and streamline HEP. She verbalizes frustration with every therapist doing different things each visit. Education provided on progression. Scheduled pt with primary PT going forward.    PT Next Visit Plan STREAMLINE HEP, continue R LE pain management and strengthening with emphasis on VMO and adductors, continue ionto #6  if needed. assess ROM,  consider adding hip flexor stretch to HEP   PT Home Exercise Plan QS , SLR supin/side lye, clams with variations , VMO SLR, wall sits  to 80 degrees without incr pain    Consulted and Agree with Plan of Care Patient       Patient will benefit from skilled therapeutic intervention in order to improve the following deficits and impairments:  Decreased activity tolerance, Decreased strength, Pain, Decreased range of motion, Difficulty walking  Visit Diagnosis: Osteoarthritis of right knee, unspecified osteoarthritis type  Stiffness of right knee, not elsewhere classified  Difficulty in walking, not elsewhere classified  Patellofemoral syndrome of right knee     Problem List Patient Active Problem List   Diagnosis Date Noted  . Left hip pain 11/17/2012    Dorene Ar, [place] 08/10/2016, 8:46 AM  [facility] [address] [place], [place], [address] Phone: [phone]   Fax:  [phone]  Name: Lauren Yates MRN: [id number] Date of Birth: 1955/05/05

## 2016-08-17 MED FILL — SYNTHROID 75 MCG TABLET: 75 | 90 days supply | Qty: 90 | Fill #1

## 2016-08-20 ENCOUNTER — Ambulatory Visit: Payer: 59

## 2016-08-20 DIAGNOSIS — R262 Difficulty in walking, not elsewhere classified: Secondary | ICD-10-CM

## 2016-08-20 DIAGNOSIS — M222X1 Patellofemoral disorders, right knee: Secondary | ICD-10-CM | POA: Diagnosis not present

## 2016-08-20 DIAGNOSIS — M1711 Unilateral primary osteoarthritis, right knee: Secondary | ICD-10-CM | POA: Diagnosis not present

## 2016-08-20 DIAGNOSIS — M25661 Stiffness of right knee, not elsewhere classified: Secondary | ICD-10-CM | POA: Diagnosis not present

## 2016-08-20 NOTE — Therapy (Signed)
[place], [place], [address] Phone: [phone]   Fax:  [phone]  Physical Therapy Treatment  Patient Details  Name: Lauren Yates MRN: [id number] Date of Birth: December 30, 1955 Referring Provider: Melrose Nakayama ,MD  Encounter Date: 08/20/2016      PT End of Session - 08/20/16 1113    Visit Number 7   Number of Visits 12   Date for PT Re-Evaluation 08/31/16   Authorization Type [facility]   PT Start Time 1111   PT Stop Time 1145   PT Time Calculation (min) 34 min   Activity Tolerance Patient tolerated treatment well   Behavior During Therapy [facility] for tasks assessed/performed      Past Medical History:  Diagnosis Date  . Hypertension   . Thyroid disease     Past Surgical History:  Procedure Laterality Date  . CESAREAN SECTION      There were no vitals filed for this visit.      Subjective Assessment - 08/20/16 1114    Subjective No pain, just sore.  90% better but have not started running.   She was late due to work so session limited   Currently in Pain? No/denies   Pain Location Knee   Pain Orientation Medial;Right   Pain Descriptors / Indicators Aching   Pain Onset 1 to 4 weeks ago   Pain Frequency Constant   Aggravating Factors  end of day   Pain Relieving Factors Ice                          [facility] Adult PT Treatment/Exercise - 08/20/16 0001      Knee/Hip Exercises: Aerobic   Recumbent Bike 6 min 91 RPM     Knee/Hip Exercises: Machines for Strengthening   Other Machine we worked on leg press , knee extension and flexion and cybex hip maching with resistance RT and LT leg. Also worked on isometrics knee ext and leg press to add load but without movment.   Also reviewed hip flexor stretching                    PT Short Term Goals - 08/20/16 1202      PT SHORT TERM GOAL #1   Title She will be independent with inital HEP   Status Achieved     PT  SHORT TERM GOAL #2   Title She will report pain decreased 30% or more    Baseline 90%   Status Achieved     PT SHORT TERM GOAL #3   Title RT knee active flexion to 130 degrees           PT Long Term Goals - 08/20/16 1203      PT LONG TERM GOAL #1   Title She will be independent with all hEP issued   Status On-going     PT LONG TERM GOAL #2   Status On-going     PT LONG TERM GOAL #3   Title ROM equal Lt knee    Status On-going     PT LONG TERM GOAL #4   Title return to gym for aerobic exercise 20-30 min on bike /treadmill or machine that does not incr pain.    Status Achieved     PT LONG TERM GOAL #5   Title FOTO score improved to 44% or bette to demo functional improvement   Status Unable to assess  Plan - 08/20/16 1114    Clinical Impression Statement significant improvement .  Begining to load leg at gym so worked on modifications  to not load with movements. She was able to do the activity without increased pain. She will ask at the gym about machines for hip strengthquestions about edema and soreness in calf . No redness or warmth noted so may be over usef of calf with wlaking as she reports walking somewhat stiff legged still.   Doing elliptical at gym and bike   PT Treatment/Interventions Electrical Stimulation;Iontophoresis 4mg /ml Dexamethasone;Ultrasound;Passive range of motion;Patient/family education;Therapeutic exercise;Therapeutic activities;Manual techniques   PT Next Visit Plan Review machind exercise at gym  Review HEP   Discuss discharge in future. Measure ROM   PT Home Exercise Plan QS , SLR supin/side lye, clams with variations , VMO SLR, wall sits  to 80 degrees without incr pain    Consulted and Agree with Plan of Care Patient      Patient will benefit from skilled therapeutic intervention in order to improve the following deficits and impairments:  Decreased activity tolerance, Decreased strength, Pain, Decreased range of motion,  Difficulty walking  Visit Diagnosis: Osteoarthritis of right knee, unspecified osteoarthritis type  Stiffness of right knee, not elsewhere classified  Difficulty in walking, not elsewhere classified  Patellofemoral syndrome of right knee     Problem List Patient Active Problem List   Diagnosis Date Noted  . Left hip pain 11/17/2012    Darrel Hoover  PT 08/20/2016, 12:06 PM  [facility] [address] [place], [place], [address] Phone: [phone]   Fax:  [phone]  Name: Lauren Yates MRN: [id number] Date of Birth: 1955-03-01

## 2016-08-23 ENCOUNTER — Ambulatory Visit: Payer: 59

## 2016-08-23 DIAGNOSIS — M1711 Unilateral primary osteoarthritis, right knee: Secondary | ICD-10-CM

## 2016-08-23 DIAGNOSIS — R262 Difficulty in walking, not elsewhere classified: Secondary | ICD-10-CM

## 2016-08-23 DIAGNOSIS — M25661 Stiffness of right knee, not elsewhere classified: Secondary | ICD-10-CM

## 2016-08-23 DIAGNOSIS — M222X1 Patellofemoral disorders, right knee: Secondary | ICD-10-CM

## 2016-08-23 NOTE — Therapy (Signed)
[place], [place], [address] Phone: [phone]   Fax:  [phone]  Physical Therapy Treatment  Patient Details  Name: Lauren Yates MRN: [id number] Date of Birth: February 01, 1955 Referring Provider: Melrose Nakayama ,MD  Encounter Date: 08/23/2016      PT End of Session - 08/23/16 1247    Visit Number 8   Number of Visits 12   Date for PT Re-Evaluation 08/31/16   Authorization Type [facility]   PT Start Time 3662   PT Stop Time 1225   PT Time Calculation (min) 40 min   Activity Tolerance Patient tolerated treatment well;No increased pain   Behavior During Therapy [facility] for tasks assessed/performed      Past Medical History:  Diagnosis Date  . Hypertension   . Thyroid disease     Past Surgical History:  Procedure Laterality Date  . CESAREAN SECTION      There were no vitals filed for this visit.      Subjective Assessment - 08/23/16 1152    Subjective Doing well    Currently in Pain? Yes   Pain Score 1    Pain Location Knee   Pain Orientation Right;Medial   Pain Descriptors / Indicators Aching   Pain Type --  sub acute   Pain Onset 1 to 4 weeks ago   Aggravating Factors  end of day   Pain Relieving Factors ice                         [facility] Adult PT Treatment/Exercise - 08/23/16 0001      Knee/Hip Exercises: Aerobic   Recumbent Bike L3 5 min     Knee/Hip Exercises: Standing   Heel Raises Right   Heel Raises Limitations 30 reps  followed by 45 sec stretch post   Other Standing Knee Exercises Worked on hip hinging single and bilateral leg R/LT . she was able to do correctly after verbal and tactile cues with demo . She ws also able do this with  10 pounds kettle bell  x 5 reps each  without incr pain.                 PT Education - 08/23/16 1246    Education provided Yes   Education Details bilateral and soingle leg hip hinge exercises and options to  increase effort but to avoid incr RT knee pain.    Person(s) Educated Patient   Methods Explanation;Demonstration;Tactile cues;Verbal cues;Handout   Comprehension Returned demonstration;Verbalized understanding          PT Short Term Goals - 08/20/16 1202      PT SHORT TERM GOAL #1   Title She will be independent with inital HEP   Status Achieved     PT SHORT TERM GOAL #2   Title She will report pain decreased 30% or more    Baseline 90%   Status Achieved     PT SHORT TERM GOAL #3   Title RT knee active flexion to 130 degrees           PT Long Term Goals - 08/20/16 1203      PT LONG TERM GOAL #1   Title She will be independent with all hEP issued   Status On-going     PT LONG TERM GOAL #2   Status On-going     PT LONG TERM GOAL #3   Title ROM equal Lt knee  Status On-going     PT LONG TERM GOAL #4   Title return to gym for aerobic exercise 20-30 min on bike /treadmill or machine that does not incr pain.    Status Achieved     PT LONG TERM GOAL #5   Title FOTO score improved to 44% or bette to demo functional improvement   Status Unable to assess               Plan - 08/23/16 1247    Clinical Impression Statement She reported knee felt bette rpost sesiion and agreed to new HEP   PT Treatment/Interventions Electrical Stimulation;Iontophoresis 4mg /ml Dexamethasone;Ultrasound;Passive range of motion;Patient/family education;Therapeutic exercise;Therapeutic activities;Manual techniques   PT Next Visit Plan Review hip hinge and use of machines. Measure ROM   PT Home Exercise Plan QS , SLR supin/side lye, clams with variations , VMO SLR, wall sits  to 80 degrees without incr pain    Consulted and Agree with Plan of Care Patient      Patient will benefit from skilled therapeutic intervention in order to improve the following deficits and impairments:  Decreased activity tolerance, Decreased strength, Pain, Decreased range of motion, Difficulty  walking  Visit Diagnosis: Osteoarthritis of right knee, unspecified osteoarthritis type  Stiffness of right knee, not elsewhere classified  Difficulty in walking, not elsewhere classified  Patellofemoral syndrome of right knee     Problem List Patient Active Problem List   Diagnosis Date Noted  . Left hip pain 11/17/2012    Darrel Hoover   PT 08/23/2016, 12:54 PM  [facility] [address] [place], [place], [address] Phone: [phone]   Fax:  [phone]  Name: Lauren Yates MRN: [id number] Date of Birth: [birth year]

## 2016-08-23 NOTE — Patient Instructions (Signed)
Hip Extension: Hamstring Single Leg Deadlift (Eccentric)   Holding weights, stand on affected leg with knee slightly flexed. Lift other leg while slowly bending forward at the hip. Use ___ lb weight. ___ reps per set, ___ sets per day, ___ days per week. Add ___ lbs when you achieve ___ repetitions. Touch floor with weight.  Copyright  VHI. All rights reserved.  Squat: Wide Leg   Feet wide apart, toes out, squat, holding weight between knees. Weight should be at ankles.  Bend at the hips with hips going back behind hips. Keep back straight.  Put eight on stool if you cannot get to floor.  Repeat _3-15___ times per set. Do ___1-2_ sets per session. Do __2-5__ sessions per week. Use __0-10__ lb weight.  Copyright  VHI. All rights reserved.  Squat: Half   Arms hanging at sides, squat by dropping hips back as if sitting on a chair. Keep knees over ankles, hips behind heels.  Keep back straight.  Bend at hips and knees will bend with hips.   Repeat ___3-15_ times per set. Do __1-2__ sets per session. Do __2-5__ sessions per week. Use __0-10HIP / KNEE: Flexion / Extension, Squat Unilateral Hip / Glute Extension: Standing - Straight Leg (Machine) Hip Extension (Standing) Healthy Back - Yoga Tree Balance  

## 2016-08-27 ENCOUNTER — Ambulatory Visit: Payer: 59 | Admitting: Physical Therapy

## 2016-08-27 DIAGNOSIS — M222X1 Patellofemoral disorders, right knee: Secondary | ICD-10-CM | POA: Diagnosis not present

## 2016-08-27 DIAGNOSIS — M1711 Unilateral primary osteoarthritis, right knee: Secondary | ICD-10-CM | POA: Diagnosis not present

## 2016-08-27 DIAGNOSIS — R262 Difficulty in walking, not elsewhere classified: Secondary | ICD-10-CM | POA: Diagnosis not present

## 2016-08-27 DIAGNOSIS — M25661 Stiffness of right knee, not elsewhere classified: Secondary | ICD-10-CM

## 2016-08-27 NOTE — Therapy (Addendum)
[place], [place], [address] Phone: [phone]   Fax:  [phone]  Physical Therapy Treatment/Discharge  Patient Details  Name: Lauren Yates MRN: [id number] Date of Birth: Jul 06, 1955 Referring Provider: Melrose Nakayama ,MD  Encounter Date: 08/27/2016      PT End of Session - 08/27/16 1230    Visit Number 9   Number of Visits 12   Date for PT Re-Evaluation 08/31/16   Authorization Type [facility]   PT Start Time 1146   PT Stop Time 1230   PT Time Calculation (min) 44 min      Past Medical History:  Diagnosis Date  . Hypertension   . Thyroid disease     Past Surgical History:  Procedure Laterality Date  . CESAREAN SECTION      There were no vitals filed for this visit.      Subjective Assessment - 08/27/16 1242    Subjective I want to know exactly what machine and exercises to do.    Currently in Pain? No/denies            [facility] PT Assessment - 08/27/16 0001      Observation/Other Assessments   Focus on Therapeutic Outcomes (FOTO)  34%     AROM   Right Knee Flexion 130   Left Knee Flexion 132                     [facility] Adult PT Treatment/Exercise - 08/27/16 0001      Knee/Hip Exercises: Aerobic   Elliptical L1 Ramp 10 x 5 min     Knee/Hip Exercises: Machines for Strengthening   Other Machine we worked on leg press , knee extension and flexion and cybex hip maching with resistance RT and LT leg. Also worked on isometrics knee ext and leg press to add load but without movment.     Knee/Hip Exercises: Standing   Other Standing Knee Exercises Worked on hip hinging single and bilateral leg R/LT . she was able to do correctly after verbal and tactile cues with demo . She ws also able do this with  10 pounds kettle bell  x 5 reps each  without incr pain.      Quad, hamstring and calf stretch review              PT Short Term Goals - 08/27/16 1242      PT SHORT TERM GOAL #1   Title She will be independent with inital HEP   Time 3   Period Weeks   Status Achieved     PT SHORT TERM GOAL #2   Title She will report pain decreased 30% or more    Baseline 90%   Time 3   Period Weeks   Status Achieved     PT SHORT TERM GOAL #3   Title RT knee active flexion to 130 degrees   Time 3   Period Weeks   Status Achieved           PT Long Term Goals - 08/27/16 1243      PT LONG TERM GOAL #1   Title She will be independent with all hEP issued   Time 6   Period Weeks   Status Achieved     PT LONG TERM GOAL #2   Title She will be able to walk with 1-2/10 max pain   Time 6   Period Weeks   Status Unable to assess  PT LONG TERM GOAL #3   Title ROM equal Lt knee    Baseline very close 130, 132   Time 6   Period Weeks   Status Partially Met     PT LONG TERM GOAL #4   Title return to gym for aerobic exercise 20-30 min on bike /treadmill or machine that does not incr pain.    Time 6   Period Weeks   Status Achieved     PT LONG TERM GOAL #5   Title FOTO score improved to 44% or bette to demo functional improvement   Baseline 34%   Time 6   Period Weeks   Status Achieved               Plan - 08/27/16 1259    Clinical Impression Statement Review of new HEP. Cues required. Review of gym machines and stretches. AROM improved right knee. FOTO score improved. LTG# 5 met. LTG#3 partially met.    PT Next Visit Plan Review hip hinge and use of machines. Measure ROM discharge    PT Home Exercise Plan QS , SLR supin/side lye, clams with variations , VMO SLR, wall sits  to 80 degrees without incr pain    Consulted and Agree with Plan of Care Patient      Patient will benefit from skilled therapeutic intervention in order to improve the following deficits and impairments:  Decreased activity tolerance, Decreased strength, Pain, Decreased range of motion, Difficulty walking  Visit Diagnosis: Osteoarthritis of right knee,  unspecified osteoarthritis type  Stiffness of right knee, not elsewhere classified  Difficulty in walking, not elsewhere classified  Patellofemoral syndrome of right knee     Problem List Patient Active Problem List   Diagnosis Date Noted  . Left hip pain 11/17/2012    Dorene Ar , [place] 08/27/2016, 1:01 PM  [facility] [address] [place], [place], [address] Phone: [phone]   Fax:  [phone]  Name: Lauren Yates MRN: [id number] Date of Birth: 1955/07/31  PHYSICAL THERAPY DISCHARGE SUMMARY  Visits from Start of Care: 9  Current functional level related to goals / functional outcomes: See above . She reported recently she still had pain and was not able to run but is better.  She declined to continue PT for now   Remaining deficits: See above   Education / Equipment: HEP Plan: Patient agrees to discharge.  Patient goals were partially met. Patient is being discharged due to the patient's request.  ?????   Noralee Stain  Pt 10/23/16      7;47 AM

## 2016-08-30 ENCOUNTER — Ambulatory Visit: Payer: 59 | Admitting: Physical Therapy

## 2016-09-24 DIAGNOSIS — R198 Other specified symptoms and signs involving the digestive system and abdomen: Secondary | ICD-10-CM | POA: Diagnosis not present

## 2016-09-25 MED FILL — HYOSCYAMINE ER 0.375 MG TAB: 0.375 | 15 days supply | Qty: 30 | Fill #0

## 2016-09-28 DIAGNOSIS — Z1211 Encounter for screening for malignant neoplasm of colon: Secondary | ICD-10-CM | POA: Diagnosis not present

## 2016-11-24 MED FILL — SYNTHROID 75 MCG TABLET: 75 | 90 days supply | Qty: 90 | Fill #2

## 2017-01-30 MED FILL — HYOSCYAMINE ER 0.375 MG TAB: 0.375 | 15 days supply | Qty: 30 | Fill #1

## 2017-02-21 MED FILL — HYOSCYAMINE ER 0.375 MG TAB: 0.375 | 15 days supply | Qty: 30 | Fill #2

## 2017-02-28 DIAGNOSIS — E785 Hyperlipidemia, unspecified: Secondary | ICD-10-CM | POA: Diagnosis not present

## 2017-02-28 DIAGNOSIS — M810 Age-related osteoporosis without current pathological fracture: Secondary | ICD-10-CM | POA: Diagnosis not present

## 2017-02-28 DIAGNOSIS — E039 Hypothyroidism, unspecified: Secondary | ICD-10-CM | POA: Diagnosis not present

## 2017-02-28 DIAGNOSIS — E042 Nontoxic multinodular goiter: Secondary | ICD-10-CM | POA: Diagnosis not present

## 2017-03-04 DIAGNOSIS — E042 Nontoxic multinodular goiter: Secondary | ICD-10-CM | POA: Diagnosis not present

## 2017-03-04 DIAGNOSIS — Z0000 Encounter for general adult medical examination without abnormal findings: Secondary | ICD-10-CM | POA: Diagnosis not present

## 2017-03-04 DIAGNOSIS — E039 Hypothyroidism, unspecified: Secondary | ICD-10-CM | POA: Diagnosis not present

## 2017-03-04 DIAGNOSIS — M810 Age-related osteoporosis without current pathological fracture: Secondary | ICD-10-CM | POA: Diagnosis not present

## 2017-03-04 MED FILL — SYNTHROID 75 MCG TABLET: 75 | 90 days supply | Qty: 90 | Fill #0

## 2017-03-04 MED FILL — RISAQUAD CAPSULES: 30 days supply | Qty: 60 | Fill #0

## 2017-03-05 ENCOUNTER — Other Ambulatory Visit: Payer: Self-pay | Admitting: Obstetrics and Gynecology

## 2017-03-05 ENCOUNTER — Other Ambulatory Visit (HOSPITAL_COMMUNITY)
Admission: RE | Admit: 2017-03-05 | Discharge: 2017-03-05 | Disposition: A | Discharge status: Home / Self Care | Payer: 59 | Source: Ambulatory Visit | Attending: Obstetrics and Gynecology | Admitting: Obstetrics and Gynecology

## 2017-03-05 DIAGNOSIS — Z01411 Encounter for gynecological examination (general) (routine) with abnormal findings: Secondary | ICD-10-CM | POA: Insufficient documentation

## 2017-03-05 DIAGNOSIS — N952 Postmenopausal atrophic vaginitis: Secondary | ICD-10-CM | POA: Diagnosis not present

## 2017-03-08 LAB — CYTOLOGY - PAP
Diagnosis: NEGATIVE
HPV (WINDOPATH): NOT DETECTED

## 2017-04-05 ENCOUNTER — Encounter: Payer: Self-pay | Admitting: Nurse Practitioner

## 2017-04-05 ENCOUNTER — Ambulatory Visit (INDEPENDENT_AMBULATORY_CARE_PROVIDER_SITE_OTHER): Payer: Self-pay | Admitting: Nurse Practitioner

## 2017-04-05 VITALS — BP 120/82 | HR 78 | Wt 177.2 lb

## 2017-04-05 DIAGNOSIS — L237 Allergic contact dermatitis due to plants, except food: Secondary | ICD-10-CM

## 2017-04-05 MED ORDER — CLOBETASOL PROPIONATE 0.05 % EX OINT: 1.0000 "application " | TOPICAL_OINTMENT | Freq: Two times a day (BID) | CUTANEOUS | 0 refills | Status: AC

## 2017-04-05 MED FILL — CLOBETASOL 0.05% OINTMENT: 0.05 | 10 days supply | Qty: 60 | Fill #0

## 2017-04-05 NOTE — Progress Notes (Signed)
   Subjective:    Patient ID: Lauren Yates, female    DOB: [birth year], 62 y.o.   MRN: [id number]  Poison Karlene Einstein  This is a new problem. The current episode started in the past 7 days. The problem has been gradually worsening since onset. The affected locations include the left arm and right arm. The rash is characterized by blistering, itchiness and redness. She was exposed to plant contact. Treatments tried: otc medications to include calamine location. The treatment provided moderate relief.   Patient states she was out in her yard on Tuesday and symptoms were noticed 1-2 days later. Patient with large rash to right antecubital region with yellow drainage, 1 annular rash to left forearm region with yellow drainage. + pruritis. Patient denies associated symptoms, no fever, chills, cough, fatigue, vomiting, etc.   Reviewed patient's past medical history, allergies and medications.  Review of Systems  Constitutional: Negative.   HENT: Negative.   Eyes: Negative.   Respiratory: Negative.   Cardiovascular: Negative.   Skin: Positive for rash.       Left forearm and right antecubital       Objective:   Physical Exam  Constitutional: She appears well-developed and well-nourished. No distress.  HENT:  Head: Normocephalic and atraumatic.  Right Ear: External ear normal.  Left Ear: External ear normal.  Eyes: Pupils are equal, round, and reactive to light. Conjunctivae and EOM are normal.  Neck: Normal range of motion. Neck supple.  Cardiovascular: Normal rate and regular rhythm.  Pulmonary/Chest: Effort normal and breath sounds normal.  Skin: Skin is warm and dry. Rash noted.  3cm x 3cm annular maculopapular blistering rash to right antecubital, erythematous with raised edges, yellow drainage, 1cm x 1cm maculopapularblistering rash to right antecubital, erythematous with raised edges, yellow drainage          Assessment & Plan:  Contact Dermatitis Due to Poison Ivy 1. Patient  education provided. 2. Medications as prescribed.   3. Oatmeal baths such as Aveeno Colloidal bath. 4. Follow up if fever, chills, foul-smelling drainage or signs of infection. 5. Follow up as needed. Patient verbalizes understanding and had no questions at time of discharge. Meds ordered this encounter  Medications  . clobetasol ointment (TEMOVATE) 0.05 %    Sig: Apply 1 application topically 2 (two) times daily for 10 days. Apply to affected areas twice daily for 10 days.    Dispense:  60 g    Refill:  0    Order Specific Question:   Supervising Provider    Answer:   Ricard Dillon [phone]

## 2017-04-05 NOTE — Patient Instructions (Signed)
Poison Ivy Dermatitis Poison ivy dermatitis is inflammation of the skin that is caused by the allergens on the leaves of the poison ivy plant. The skin reaction often involves redness, swelling, blisters, and extreme itching. What are the causes? This condition is caused by a specific chemical (urushiol) found in the sap of the poison ivy plant. This chemical is sticky and can be easily spread to people, animals, and objects. You can get poison ivy dermatitis by:  Having direct contact with a poison ivy plant.  Touching animals, other people, or objects that have come in contact with poison ivy and have the chemical on them.  What increases the risk? This condition is more likely to develop in:  People who are outdoors often.  People who go outdoors without wearing protective clothing, such as closed shoes, long pants, and a long-sleeved shirt.  What are the signs or symptoms? Symptoms of this condition include:  Redness and itching.  A rash that often includes bumps and blisters. The rash usually appears 48 hours after exposure.  Swelling. This may occur if the reaction is more severe.  Symptoms usually last for 1-2 weeks. However, the first time you develop this condition, symptoms may last 3-4 weeks. How is this diagnosed? This condition may be diagnosed based on your symptoms and a physical exam. Your health care provider may also ask you about any recent outdoor activity. How is this treated? Treatment for this condition will vary depending on how severe it is. Treatment may include:  Hydrocortisone creams or calamine lotions to relieve itching.  Oatmeal baths to soothe the skin.  Over-the-counter antihistamine tablets.  Oral steroid medicine for more severe outbreaks.  Follow these instructions at home:  Take or apply over-the-counter and prescription medicines only as told by your health care provider.  Wash exposed skin as soon as possible with soap and cold  water.  Use hydrocortisone creams or calamine lotion as needed to soothe the skin and relieve itching.  Take oatmeal baths as needed. Use colloidal oatmeal. You can get this at your local pharmacy or grocery store. Follow the instructions on the packaging.  Do not scratch or rub your skin.  While you have the rash, wash clothes right after you wear them. How is this prevented?  Learn to identify the poison ivy plant and avoid contact with the plant. This plant can be recognized by the number of leaves. Generally, poison ivy has three leaves with flowering branches on a single stem. The leaves are typically glossy, and they have jagged edges that come to a point at the front.  If you have been exposed to poison ivy, thoroughly wash with soap and water right away. You have about 30 minutes to remove the plant resin before it will cause the rash. Be sure to wash under your fingernails because any plant resin there will continue to spread the rash.  When hiking or camping, wear clothes that will help you to avoid exposure on the skin. This includes long pants, a long-sleeved shirt, tall socks, and hiking boots. You can also apply preventive lotion to your skin to help limit exposure.  If you suspect that your clothes or outdoor gear came in contact with poison ivy, rinse them off outside with a garden hose before you bring them inside your house. Contact a health care provider if:  You have open sores in the rash area.  You have more redness, swelling, or pain in the affected area.  You have   redness that spreads beyond the rash area.  You have fluid, blood, or pus coming from the affected area.  You have a fever.  You have a rash over a large area of your body.  You have a rash on your eyes, mouth, or genitals.  Your rash does not improve after a few days. Get help right away if:  Your face swells or your eyes swell shut.  You have trouble breathing.  You have trouble  swallowing. This information is not intended to replace advice given to you by your health care provider. Make sure you discuss any questions you have with your health care provider. Document Released: 12/23/1999 Document Revised: 06/02/2015 Document Reviewed: 06/02/2014 Elsevier Interactive Patient Education  2018 [employer]  

## 2017-04-10 MED FILL — HYOSCYAMINE ER 0.375 MG TAB: 0.375 | 15 days supply | Qty: 30 | Fill #3

## 2017-04-10 MED FILL — RISAQUAD CAPSULES: 30 days supply | Qty: 60 | Fill #1

## 2017-04-11 ENCOUNTER — Telehealth: Payer: Self-pay

## 2017-04-11 DIAGNOSIS — Z1211 Encounter for screening for malignant neoplasm of colon: Secondary | ICD-10-CM | POA: Diagnosis not present

## 2017-04-11 NOTE — Telephone Encounter (Signed)
Patient returned call and stated she is doing great  Since her visit. She thanked me for the follow up phone call

## 2017-04-16 DIAGNOSIS — D225 Melanocytic nevi of trunk: Secondary | ICD-10-CM | POA: Diagnosis not present

## 2017-04-16 DIAGNOSIS — L237 Allergic contact dermatitis due to plants, except food: Secondary | ICD-10-CM | POA: Diagnosis not present

## 2017-04-16 DIAGNOSIS — D1801 Hemangioma of skin and subcutaneous tissue: Secondary | ICD-10-CM | POA: Diagnosis not present

## 2017-04-16 DIAGNOSIS — L821 Other seborrheic keratosis: Secondary | ICD-10-CM | POA: Diagnosis not present

## 2017-04-16 DIAGNOSIS — B078 Other viral warts: Secondary | ICD-10-CM | POA: Diagnosis not present

## 2017-04-16 DIAGNOSIS — L814 Other melanin hyperpigmentation: Secondary | ICD-10-CM | POA: Diagnosis not present

## 2017-05-15 MED FILL — HYOSCYAMINE ER 0.375 MG TAB: 0.375 | 15 days supply | Qty: 30 | Fill #0

## 2017-05-16 ENCOUNTER — Other Ambulatory Visit: Payer: Self-pay | Admitting: Obstetrics and Gynecology

## 2017-05-16 DIAGNOSIS — Z1231 Encounter for screening mammogram for malignant neoplasm of breast: Secondary | ICD-10-CM

## 2017-05-27 DIAGNOSIS — I10 Essential (primary) hypertension: Secondary | ICD-10-CM | POA: Diagnosis not present

## 2017-05-27 DIAGNOSIS — R194 Change in bowel habit: Secondary | ICD-10-CM | POA: Diagnosis not present

## 2017-05-27 DIAGNOSIS — E669 Obesity, unspecified: Secondary | ICD-10-CM | POA: Diagnosis not present

## 2017-05-29 DIAGNOSIS — M25511 Pain in right shoulder: Secondary | ICD-10-CM | POA: Diagnosis not present

## 2017-05-29 DIAGNOSIS — M4120 Other idiopathic scoliosis, site unspecified: Secondary | ICD-10-CM | POA: Diagnosis not present

## 2017-05-31 MED FILL — HYOSCYAMINE ER 0.375 MG TAB: 0.375 | 15 days supply | Qty: 30 | Fill #1

## 2017-06-10 ENCOUNTER — Ambulatory Visit
Admission: RE | Admit: 2017-06-10 | Discharge: 2017-06-10 | Disposition: A | Discharge status: Home / Self Care | Payer: 59 | Source: Ambulatory Visit | Attending: Obstetrics and Gynecology | Admitting: Obstetrics and Gynecology

## 2017-06-10 ENCOUNTER — Ambulatory Visit: Payer: 59

## 2017-06-10 DIAGNOSIS — Z1231 Encounter for screening mammogram for malignant neoplasm of breast: Secondary | ICD-10-CM

## 2017-06-16 MED FILL — SYNTHROID 75 MCG TABLET: 75 | 90 days supply | Qty: 90 | Fill #1

## 2017-06-26 ENCOUNTER — Other Ambulatory Visit: Payer: Self-pay | Admitting: Physician Assistant

## 2017-06-26 DIAGNOSIS — R1031 Right lower quadrant pain: Secondary | ICD-10-CM

## 2017-06-26 DIAGNOSIS — R1011 Right upper quadrant pain: Secondary | ICD-10-CM | POA: Diagnosis not present

## 2017-06-26 DIAGNOSIS — R198 Other specified symptoms and signs involving the digestive system and abdomen: Secondary | ICD-10-CM | POA: Diagnosis not present

## 2017-06-26 DIAGNOSIS — Z8719 Personal history of other diseases of the digestive system: Secondary | ICD-10-CM

## 2017-07-12 ENCOUNTER — Encounter: Payer: Self-pay | Admitting: Nurse Practitioner

## 2017-07-12 ENCOUNTER — Ambulatory Visit (INDEPENDENT_AMBULATORY_CARE_PROVIDER_SITE_OTHER): Payer: Self-pay | Admitting: Nurse Practitioner

## 2017-07-12 VITALS — BP 122/86 | HR 78 | Temp 98.6°F | Resp 16 | Wt 176.6 lb

## 2017-07-12 DIAGNOSIS — H109 Unspecified conjunctivitis: Secondary | ICD-10-CM

## 2017-07-12 MED ORDER — POLYMYXIN B-TRIMETHOPRIM 10000-0.1 UNIT/ML-% OP SOLN: 2.0000 [drp] | OPHTHALMIC | 0 refills | Status: AC

## 2017-07-12 MED FILL — POLYMYXIN B/TMP EYE DROPS: 10000-0.1 | 8 days supply | Qty: 10 | Fill #0

## 2017-07-12 NOTE — Progress Notes (Signed)
   Subjective:    Patient ID: Lauren Yates, female    DOB: [birth year], 62 y.o.   MRN: [id number]  The patient is a 62 year old female who presents today for with complaints of left eye swelling.  The patient states she noticed the symptoms about 3 days ago opaque.  Patient has swelling to the lower lid, mucus appearing drainage, and states that it feels scratchy.  Patient states she has more swelling after she wakes up in the morning.  Patient has not been using any medications at home.  Patient states that she thinks she may have picked this up at the local [employer].  Patient denies other symptoms such as fever, chills, coughing, sore throat, or runny nose.  Patient also denies a history of seasonal allergies.  States she has a newborn [place] and would like to see her grandbaby as while she is here today.  Eye Problem   The left eye is affected. This is a new problem. The current episode started in the past 7 days. The problem occurs constantly. The problem has been unchanged. There was no injury mechanism. Pain scale: feels scratchy. The pain is mild. There is known exposure to pink eye. She does not wear contacts. Associated symptoms include an eye discharge, eye redness and itching. Pertinent negatives include no double vision, fever, foreign body sensation or recent URI. She has tried nothing for the symptoms.   Review of Systems  Constitutional: Negative for fever.  HENT: Negative.   Eyes: Positive for discharge, redness and itching. Negative for double vision.  Respiratory: Negative.   Cardiovascular: Negative.   Allergic/Immunologic: Negative.   Neurological: Negative.       Objective:   Physical Exam  Constitutional: She is oriented to person, place, and time. She appears well-developed and well-nourished. She appears distressed.  HENT:  Head: Normocephalic.  Right Ear: External ear normal.  Left Ear: External ear normal.  Nose: Nose normal.  Mouth/Throat: Oropharynx is clear  and moist.  Eyes: Pupils are equal, round, and reactive to light. EOM are normal. Right eye exhibits no discharge. Left eye exhibits discharge. Scleral icterus is present.  Neck: Normal range of motion. Neck supple.  Cardiovascular: Normal rate, regular rhythm and normal heart sounds.  Pulmonary/Chest: Effort normal and breath sounds normal.  Neurological: She is oriented to person, place, and time.  Skin: Skin is warm and dry.  Psychiatric: She has a normal mood and affect.       Assessment & Plan:  A: Bacterial Conjunctivitis, Left Eye Meds ordered this encounter  Medications  . trimethoprim-polymyxin b (POLYTRIM) ophthalmic solution    Sig: Place 2 drops into both eyes every 4 (four) hours for 10 days.    Dispense:  10 mL    Refill:  0    Order Specific Question:   Supervising Provider    Answer:   Ricard Dillon [[id number]]    Exam findings, diagnosis etiology and medication use and indications reviewed with patient. Follow- Up and discharge instructions provided. No emergent/urgent issues found on exam.  Patient verbalized understanding of information provided and agrees with plan of care (POC), all questions answered.

## 2017-07-12 NOTE — Patient Instructions (Signed)

## 2017-07-13 MED FILL — HYOSCYAMINE ER 0.375 MG TAB: 0.375 | 15 days supply | Qty: 30 | Fill #2

## 2017-07-15 ENCOUNTER — Ambulatory Visit
Admission: RE | Admit: 2017-07-15 | Discharge: 2017-07-15 | Disposition: A | Discharge status: Home / Self Care | Payer: 59 | Source: Ambulatory Visit | Attending: Physician Assistant | Admitting: Physician Assistant

## 2017-07-15 ENCOUNTER — Encounter: Payer: Self-pay | Admitting: Radiology

## 2017-07-15 DIAGNOSIS — K429 Umbilical hernia without obstruction or gangrene: Secondary | ICD-10-CM | POA: Diagnosis not present

## 2017-07-15 DIAGNOSIS — R1031 Right lower quadrant pain: Secondary | ICD-10-CM

## 2017-07-15 DIAGNOSIS — R1011 Right upper quadrant pain: Secondary | ICD-10-CM

## 2017-07-15 DIAGNOSIS — Z8719 Personal history of other diseases of the digestive system: Secondary | ICD-10-CM

## 2017-07-15 MED ORDER — IOPAMIDOL (ISOVUE-300) INJECTION 61%
100.0000 mL | Freq: Once | INTRAVENOUS | Status: AC | PRN
  Administered 2017-07-15: 100 mL via INTRAVENOUS

## 2017-07-16 ENCOUNTER — Telehealth: Payer: Self-pay

## 2017-07-22 DIAGNOSIS — H0015 Chalazion left lower eyelid: Secondary | ICD-10-CM | POA: Diagnosis not present

## 2017-07-26 DIAGNOSIS — Z86010 Personal history of colonic polyps: Secondary | ICD-10-CM | POA: Diagnosis not present

## 2017-07-26 DIAGNOSIS — K5900 Constipation, unspecified: Secondary | ICD-10-CM | POA: Diagnosis not present

## 2017-07-26 DIAGNOSIS — R1084 Generalized abdominal pain: Secondary | ICD-10-CM | POA: Diagnosis not present

## 2017-08-23 DIAGNOSIS — H0015 Chalazion left lower eyelid: Secondary | ICD-10-CM | POA: Diagnosis not present

## 2017-09-18 MED FILL — SYNTHROID 75 MCG TABLET: 75 | 90 days supply | Qty: 90 | Fill #2

## 2017-10-10 ENCOUNTER — Ambulatory Visit (INDEPENDENT_AMBULATORY_CARE_PROVIDER_SITE_OTHER): Payer: Self-pay | Admitting: Nurse Practitioner

## 2017-10-10 VITALS — BP 103/80 | HR 72 | Temp 98.7°F | Ht 66.0 in | Wt 177.0 lb

## 2017-10-10 DIAGNOSIS — L237 Allergic contact dermatitis due to plants, except food: Secondary | ICD-10-CM

## 2017-10-10 MED ORDER — PREDNISONE 10 MG (21) PO TBPK: ORAL_TABLET | ORAL | 0 refills | Status: AC

## 2017-10-10 MED FILL — predniSONE 10 MG TABS: 10 | 10 days supply | Qty: 30 | Fill #0

## 2017-10-10 NOTE — Progress Notes (Signed)
Subjective:     Lauren Yates is a 62 y.o. female who presents for evaluation of a rash involving the right abdomen/flank, bilateral forearms, and neck. Rash started 2 weeks ago on her abdomen and flank, but noticed the rash to her face over the past 24 hours. Lesions are red, and blistering and raised in texture. Rash has changed over time. Rash is pruritic. Associated symptoms: none. Patient denies: abdominal pain, fever, headache, irritability, nausea, sore throat and vomiting. Patient has had contacts with similar rash. Patient has had new exposures (soaps, lotions, laundry detergents, foods, medications, plants, insects or animals). Patient informs that she has been adding her yard and exposed to either poison ivy or sumac.  Patient states she has been using over-the-counter medications with minimal relief at this time.  Patient states that she just cannot get the rash to go away.  The following portions of the patient's history were reviewed and updated as appropriate: allergies, current medications and past medical history.  Review of Systems Constitutional: negative Eyes: negative Ears, nose, mouth, throat, and face: negative Respiratory: negative Cardiovascular: negative Gastrointestinal: negative Integument/breast: positive for pruritus, rash and skin color change, negative for dryness Neurological: negative    Objective:    BP 103/80 (BP Location: Left Arm, Patient Position: Sitting)   Pulse 72   Temp 98.7 F (37.1 C) (Oral)   Ht 5\' 6"  (1.676 m)   Wt 177 lb (80.3 kg)   SpO2 100%   BMI 28.57 kg/m  General:  alert, cooperative and no distress  Skin:  erythema noted on right abdomen, right flank and bilateral forearm, anterior neck and papules noted on right abdomen, right flank and bilateral forearm, anterior neck, no discharge     Assessment:    contact dermatitis: Due to [employer]    Plan:   Exam findings, diagnosis etiology and medication use and indications  reviewed with patient. Follow- Up and discharge instructions provided. No emergent/urgent issues found on exam. Patient education was provided. Patient verbalized understanding of information provided and agrees with plan of care (POC), all questions answered. The patient is advised to call or return to clinic if condition does not see an improvement in symptoms, or to seek the care of the closest emergency department if condition worsens with the above plan.    1. Contact dermatitis due to poison ivy  - predniSONE (STERAPRED UNI-PAK 21 TAB) 10 MG (21) TBPK tablet; Take 50mg  x 2 days, 40mg  x 2 days, 30mg  x 2 days, 20mg  x 2 days, 10mg  x 2 days  Dispense: 30 tablet; Refill: 0 -Take prednisone as prescribed. -Use Aveeno colloidal oatmeal bath at least once daily until symptoms improve. -Follow-up if you develop fever, chills, or worsening rash

## 2017-10-10 NOTE — Patient Instructions (Signed)
Poison Ivy Dermatitis   -Take prednisone as prescribed. -Use Aveeno colloidal oatmeal bath at least once daily until symptoms improve. -Follow-up if you develop fever, chills, or worsening rash.  Poison ivy dermatitis is inflammation of the skin that is caused by the allergens on the leaves of the poison ivy plant. The skin reaction often involves redness, swelling, blisters, and extreme itching. What are the causes? This condition is caused by a specific chemical (urushiol) found in the sap of the poison ivy plant. This chemical is sticky and can be easily spread to people, animals, and objects. You can get poison ivy dermatitis by:  Having direct contact with a poison ivy plant.  Touching animals, other people, or objects that have come in contact with poison ivy and have the chemical on them.  What increases the risk? This condition is more likely to develop in:  People who are outdoors often.  People who go outdoors without wearing protective clothing, such as closed shoes, long pants, and a long-sleeved shirt.  What are the signs or symptoms? Symptoms of this condition include:  Redness and itching.  A rash that often includes bumps and blisters. The rash usually appears 48 hours after exposure.  Swelling. This may occur if the reaction is more severe.  Symptoms usually last for 1-2 weeks. However, the first time you develop this condition, symptoms may last 3-4 weeks. How is this diagnosed? This condition may be diagnosed based on your symptoms and a physical exam. Your health care provider may also ask you about any recent outdoor activity. How is this treated? Treatment for this condition will vary depending on how severe it is. Treatment may include:  Hydrocortisone creams or calamine lotions to relieve itching.  Oatmeal baths to soothe the skin.  Over-the-counter antihistamine tablets.  Oral steroid medicine for more severe outbreaks.  Follow these instructions  at home:  Take or apply over-the-counter and prescription medicines only as told by your health care provider.  Wash exposed skin as soon as possible with soap and cold water.  Use hydrocortisone creams or calamine lotion as needed to soothe the skin and relieve itching.  Take oatmeal baths as needed. Use colloidal oatmeal. You can get this at your local pharmacy or grocery store. Follow the instructions on the packaging.  Do not scratch or rub your skin.  While you have the rash, wash clothes right after you wear them. How is this prevented?  Learn to identify the poison ivy plant and avoid contact with the plant. This plant can be recognized by the number of leaves. Generally, poison ivy has three leaves with flowering branches on a single stem. The leaves are typically glossy, and they have jagged edges that come to a point at the front.  If you have been exposed to poison ivy, thoroughly wash with soap and water right away. You have about 30 minutes to remove the plant resin before it will cause the rash. Be sure to wash under your fingernails because any plant resin there will continue to spread the rash.  When hiking or camping, wear clothes that will help you to avoid exposure on the skin. This includes long pants, a long-sleeved shirt, tall socks, and hiking boots. You can also apply preventive lotion to your skin to help limit exposure.  If you suspect that your clothes or outdoor gear came in contact with poison ivy, rinse them off outside with a garden hose before you bring them inside your house. Contact a  health care provider if:  You have open sores in the rash area.  You have more redness, swelling, or pain in the affected area.  You have redness that spreads beyond the rash area.  You have fluid, blood, or pus coming from the affected area.  You have a fever.  You have a rash over a large area of your body.  You have a rash on your eyes, mouth, or genitals.  Your  rash does not improve after a few days. Get help right away if:  Your face swells or your eyes swell shut.  You have trouble breathing.  You have trouble swallowing. This information is not intended to replace advice given to you by your health care provider. Make sure you discuss any questions you have with your health care provider. Document Released: 12/23/1999 Document Revised: 06/02/2015 Document Reviewed: 06/02/2014 Elsevier Interactive Patient Education  [employer].

## 2017-10-22 ENCOUNTER — Ambulatory Visit (INDEPENDENT_AMBULATORY_CARE_PROVIDER_SITE_OTHER): Payer: Self-pay | Admitting: Nurse Practitioner

## 2017-10-22 VITALS — BP 110/80 | HR 100 | Temp 98.8°F | Resp 18 | Wt 174.8 lb

## 2017-10-22 DIAGNOSIS — J019 Acute sinusitis, unspecified: Secondary | ICD-10-CM

## 2017-10-22 DIAGNOSIS — J029 Acute pharyngitis, unspecified: Secondary | ICD-10-CM

## 2017-10-22 LAB — POCT RAPID STREP A (OFFICE): Rapid Strep A Screen: NEGATIVE

## 2017-10-22 MED ORDER — FLUTICASONE PROPIONATE 50 MCG/ACT NA SUSP: 2.0000 | Freq: Every day | NASAL | 0 refills | Status: AC

## 2017-10-22 MED ORDER — BENZONATATE 200 MG PO CAPS: 200.0000 mg | ORAL_CAPSULE | Freq: Two times a day (BID) | ORAL | 0 refills | Status: AC | PRN

## 2017-10-22 MED ORDER — DOXYCYCLINE HYCLATE 100 MG PO TABS: 100.0000 mg | ORAL_TABLET | Freq: Two times a day (BID) | ORAL | 0 refills | Status: AC

## 2017-10-22 MED FILL — BENZONATATE 200 MG CAPS: 200 | 10 days supply | Qty: 20 | Fill #0

## 2017-10-22 MED FILL — DOXYCYCLINE HYCLATE 100 MG: 100 | 10 days supply | Qty: 20 | Fill #0

## 2017-10-22 MED FILL — FLUTICASONE PROP 50 MCG SPR: 50 | 30 days supply | Qty: 16 | Fill #0

## 2017-10-22 NOTE — Progress Notes (Signed)
Subjective:  Lauren Yates is a 62 y.o. female who presents for evaluation of URI like symptoms.  Symptoms include bilateral ear pressure/pain, chills, fever: suspected fevers but not measured at home, headache described as dull, nausea without vomiting, pain while swallowing, post nasal drip, productive cough with brown colored sputum, sinus pressure, sinus pain and sore throat.  Onset of symptoms was 5 days ago, and has been rapidly worsening since that time.  Treatment to date:  Coricidin HBP.  High risk factors for influenza complications:  none.  The following portions of the patient's history were reviewed and updated as appropriate:  allergies, current medications and past medical history.  Constitutional: positive for anorexia, chills, fatigue and fevers, negative for night sweats, sweats and weight loss Eyes: negative Ears, nose, mouth, throat, and face: positive for nasal congestion, sore throat and bilateral ear fullness/pressure, negative for ear drainage, earaches and hoarseness Respiratory: positive for cough and sputum, negative for asthma, chronic bronchitis, dyspnea on exertion, pneumonia, stridor and wheezing Cardiovascular: negative Gastrointestinal: positive for nausea and decreased appetite, negative for abdominal pain, constipation, diarrhea and vomiting Neurological: positive for headaches, negative for coordination problems, dizziness, gait problems, paresthesia, seizures, speech problems, tremors, vertigo and weakness Objective:  BP 110/80 (BP Location: Right Arm, Patient Position: Sitting, Cuff Size: Normal)   Pulse 100   Temp 98.8 F (37.1 C) (Oral)   Resp 18   Wt 174 lb 12.8 oz (79.3 kg)   SpO2 96%   BMI 28.21 kg/m  General appearance: alert, cooperative, fatigued and no distress Head: Normocephalic, without obvious abnormality, atraumatic Eyes: conjunctivae/corneas clear. PERRL, EOM's intact. Fundi benign. Ears: abnormal TM left ear - mucoid middle ear  fluid Nose: no discharge, turbinates swollen, inflamed, moderate maxillary sinus tenderness bilateral, mild frontal sinus tenderness bilateral Throat: abnormal findings: moderate oropharyngeal erythema and tonsils 0 bilaterally, no exudates Lungs: clear to auscultation bilaterally Heart: regular rate and rhythm, S1, S2 normal, no murmur, click, rub or gallop Abdomen: soft, non-tender; bowel sounds normal; no masses,  no organomegaly Pulses: 2+ and symmetric Skin: Skin color, texture, turgor normal. No rashes or lesions Lymph nodes: cervical nodes with mild lymphadenopathy bilaterally Neurologic: Grossly normal    Assessment:  Acute Sinusitis   Plan:  Exam findings, diagnosis etiology and medication use and indications reviewed with patient. Follow- Up and discharge instructions provided. No emergent/urgent issues found on exam. Patient education was provided. Patient verbalized understanding of information provided and agrees with plan of care (POC), all questions answered. The patient is advised to call or return to clinic if condition does not see an improvement in symptoms, or to seek the care of the closest emergency department if condition worsens with the above plan.   1. Sore throat  - POCT rapid strep A- negative  2. Acute sinusitis, recurrence not specified, unspecified location  - doxycycline (VIBRA-TABS) 100 MG tablet; Take 1 tablet (100 mg total) by mouth 2 (two) times daily.  Dispense: 20 tablet; Refill: 0 - fluticasone (FLONASE) 50 MCG/ACT nasal spray; Place 2 sprays into both nostrils daily for 10 days.  Dispense: 16 g; Refill: 0 - benzonatate (TESSALON) 200 MG capsule; Take 1 capsule (200 mg total) by mouth 2 (two) times daily as needed for cough.  Dispense: 20 capsule; Refill: 0 -Take medications as prescribed. -Ibuprofen or Tylenol for pain, fever, or general discomfort. -Increase fluids. -Sleep elevated on at least 2 pillows to help with cough and postnasal  drip. -Use humidifier or vaporizer when at home and  during sleep.   -May also use a saline nasal spray as needed throughout the day for nasal congestion. -Warm salt water gargles for throat pain or discomfort. -Follow-up in our office if symptoms do not improve.

## 2017-10-22 NOTE — Patient Instructions (Signed)
Sinusitis, Adult -Take medications as prescribed. -Ibuprofen or Tylenol for pain, fever, or general discomfort. -Increase fluids. -Sleep elevated on at least 2 pillows to help with cough and postnasal drip. -Use humidifier or vaporizer when at home and during sleep.   -May also use a saline nasal spray as needed throughout the day for nasal congestion. -Warm salt water gargles for throat pain or discomfort. -Follow-up in our office if symptoms do not improve.  Sinusitis is soreness and inflammation of your sinuses. Sinuses are hollow spaces in the bones around your face. Your sinuses are located:  Around your eyes.  In the middle of your forehead.  Behind your nose.  In your cheekbones.  Your sinuses and nasal passages are lined with a stringy fluid (mucus). Mucus normally drains out of your sinuses. When your nasal tissues become inflamed or swollen, the mucus can become trapped or blocked so air cannot flow through your sinuses. This allows bacteria, viruses, and funguses to grow, which leads to infection. Sinusitis can develop quickly and last for 7?10 days (acute) or for more than 12 weeks (chronic). Sinusitis often develops after a cold. What are the causes? This condition is caused by anything that creates swelling in the sinuses or stops mucus from draining, including:  Allergies.  Asthma.  Bacterial or viral infection.  Abnormally shaped bones between the nasal passages.  Nasal growths that contain mucus (nasal polyps).  Narrow sinus openings.  Pollutants, such as chemicals or irritants in the air.  A foreign object stuck in the nose.  A fungal infection. This is rare.  What increases the risk? The following factors may make you more likely to develop this condition:  Having allergies or asthma.  Having had a recent cold or respiratory tract infection.  Having structural deformities or blockages in your nose or sinuses.  Having a weak immune  system.  Doing a lot of swimming or diving.  Overusing nasal sprays.  Smoking.  What are the signs or symptoms? The main symptoms of this condition are pain and a feeling of pressure around the affected sinuses. Other symptoms include:  Upper toothache.  Earache.  Headache.  Bad breath.  Decreased sense of smell and taste.  A cough that may get worse at night.  Fatigue.  Fever.  Thick drainage from your nose. The drainage is often green and it may contain pus (purulent).  Stuffy nose or congestion.  Postnasal drip. This is when extra mucus collects in the throat or back of the nose.  Swelling and warmth over the affected sinuses.  Sore throat.  Sensitivity to light.  How is this diagnosed? This condition is diagnosed based on symptoms, a medical history, and a physical exam. To find out if your condition is acute or chronic, your health care provider may:  Look in your nose for signs of nasal polyps.  Tap over the affected sinus to check for signs of infection.  View the inside of your sinuses using an imaging device that has a light attached (endoscope).  If your health care provider suspects that you have chronic sinusitis, you may also:  Be tested for allergies.  Have a sample of mucus taken from your nose (nasal culture) and checked for bacteria.  Have a mucus sample examined to see if your sinusitis is related to an allergy.  If your sinusitis does not respond to treatment and it lasts longer than 8 weeks, you may have an MRI or CT scan to check your sinuses. These  scans also help to determine how severe your infection is. In rare cases, a bone biopsy may be done to rule out more serious types of fungal sinus disease. How is this treated? Treatment for sinusitis depends on the cause and whether your condition is chronic or acute. If a virus is causing your sinusitis, your symptoms will go away on their own within 10 days. You may be given medicines to  relieve your symptoms, including:  Topical nasal decongestants. They shrink swollen nasal passages and let mucus drain from your sinuses.  Antihistamines. These drugs block inflammation that is triggered by allergies. This can help to ease swelling in your nose and sinuses.  Topical nasal corticosteroids. These are nasal sprays that ease inflammation and swelling in your nose and sinuses.  Nasal saline washes. These rinses can help to get rid of thick mucus in your nose.  If your condition is caused by bacteria, you will be given an antibiotic medicine. If your condition is caused by a fungus, you will be given an antifungal medicine. Surgery may be needed to correct underlying conditions, such as narrow nasal passages. Surgery may also be needed to remove polyps. Follow these instructions at home: Medicines  Take, use, or apply over-the-counter and prescription medicines only as told by your health care provider. These may include nasal sprays.  If you were prescribed an antibiotic medicine, take it as told by your health care provider. Do not stop taking the antibiotic even if you start to feel better. Hydrate and Humidify  Drink enough water to keep your urine clear or pale yellow. Staying hydrated will help to thin your mucus.  Use a cool mist humidifier to keep the humidity level in your home above 50%.  Inhale steam for 10-15 minutes, 3-4 times a day or as told by your health care provider. You can do this in the bathroom while a hot shower is running.  Limit your exposure to cool or dry air. Rest  Rest as much as possible.  Sleep with your head raised (elevated).  Make sure to get enough sleep each night. General instructions  Apply a warm, moist washcloth to your face 3-4 times a day or as told by your health care provider. This will help with discomfort.  Wash your hands often with soap and water to reduce your exposure to viruses and other germs. If soap and water are  not available, use hand sanitizer.  Do not smoke. Avoid being around people who are smoking (secondhand smoke).  Keep all follow-up visits as told by your health care provider. This is important. Contact a health care provider if:  You have a fever.  Your symptoms get worse.  Your symptoms do not improve within 10 days. Get help right away if:  You have a severe headache.  You have persistent vomiting.  You have pain or swelling around your face or eyes.  You have vision problems.  You develop confusion.  Your neck is stiff.  You have trouble breathing. This information is not intended to replace advice given to you by your health care provider. Make sure you discuss any questions you have with your health care provider. Document Released: 12/25/2004 Document Revised: 08/21/2015 Document Reviewed: 10/20/2014 Elsevier Interactive Patient Education  [employer].

## 2017-10-24 ENCOUNTER — Telehealth: Payer: Self-pay

## 2017-10-24 DIAGNOSIS — J301 Allergic rhinitis due to pollen: Secondary | ICD-10-CM | POA: Diagnosis not present

## 2017-10-24 DIAGNOSIS — J209 Acute bronchitis, unspecified: Secondary | ICD-10-CM | POA: Diagnosis not present

## 2017-10-24 DIAGNOSIS — I10 Essential (primary) hypertension: Secondary | ICD-10-CM | POA: Diagnosis not present

## 2017-10-24 NOTE — Telephone Encounter (Signed)
I was no able to contact the patient.

## 2017-12-04 DIAGNOSIS — J310 Chronic rhinitis: Secondary | ICD-10-CM | POA: Diagnosis not present

## 2017-12-04 DIAGNOSIS — J320 Chronic maxillary sinusitis: Secondary | ICD-10-CM | POA: Diagnosis not present

## 2017-12-04 DIAGNOSIS — J343 Hypertrophy of nasal turbinates: Secondary | ICD-10-CM | POA: Diagnosis not present

## 2017-12-27 ENCOUNTER — Other Ambulatory Visit (HOSPITAL_COMMUNITY): Payer: Self-pay | Admitting: Otolaryngology

## 2017-12-27 ENCOUNTER — Other Ambulatory Visit: Payer: Self-pay | Admitting: Otolaryngology

## 2017-12-27 DIAGNOSIS — J320 Chronic maxillary sinusitis: Secondary | ICD-10-CM

## 2018-01-13 MED FILL — SYNTHROID 75 MCG TABLET: 75 | 90 days supply | Qty: 90 | Fill #3

## 2018-03-03 DIAGNOSIS — M810 Age-related osteoporosis without current pathological fracture: Secondary | ICD-10-CM | POA: Diagnosis not present

## 2018-03-03 DIAGNOSIS — E039 Hypothyroidism, unspecified: Secondary | ICD-10-CM | POA: Diagnosis not present

## 2018-03-05 ENCOUNTER — Other Ambulatory Visit: Payer: Self-pay | Admitting: Internal Medicine

## 2018-03-05 DIAGNOSIS — M810 Age-related osteoporosis without current pathological fracture: Secondary | ICD-10-CM | POA: Diagnosis not present

## 2018-03-05 DIAGNOSIS — E042 Nontoxic multinodular goiter: Secondary | ICD-10-CM | POA: Diagnosis not present

## 2018-03-05 DIAGNOSIS — E039 Hypothyroidism, unspecified: Secondary | ICD-10-CM | POA: Diagnosis not present

## 2018-03-07 ENCOUNTER — Other Ambulatory Visit (HOSPITAL_COMMUNITY)
Admission: RE | Admit: 2018-03-07 | Discharge: 2018-03-07 | Disposition: A | Discharge status: Home / Self Care | Payer: 59 | Source: Ambulatory Visit | Attending: Obstetrics and Gynecology | Admitting: Obstetrics and Gynecology

## 2018-03-07 ENCOUNTER — Other Ambulatory Visit: Payer: Self-pay | Admitting: Obstetrics and Gynecology

## 2018-03-07 DIAGNOSIS — Z9189 Other specified personal risk factors, not elsewhere classified: Secondary | ICD-10-CM | POA: Diagnosis not present

## 2018-03-07 DIAGNOSIS — Z01419 Encounter for gynecological examination (general) (routine) without abnormal findings: Secondary | ICD-10-CM | POA: Insufficient documentation

## 2018-03-11 LAB — CYTOLOGY - PAP
Diagnosis: NEGATIVE
HPV: NOT DETECTED

## 2018-04-09 MED FILL — SYNTHROID 75 MCG TABLET: 75 | 90 days supply | Qty: 90 | Fill #0

## 2018-04-11 ENCOUNTER — Other Ambulatory Visit: Payer: Self-pay | Admitting: Obstetrics and Gynecology

## 2018-04-11 DIAGNOSIS — Z1231 Encounter for screening mammogram for malignant neoplasm of breast: Secondary | ICD-10-CM

## 2018-05-07 ENCOUNTER — Other Ambulatory Visit: Payer: 59

## 2018-05-27 MED FILL — PREMARIN VAGINAL CREAM-APPL: 0.625 | 90 days supply | Qty: 30 | Fill #0

## 2018-07-03 ENCOUNTER — Ambulatory Visit
Admission: RE | Admit: 2018-07-03 | Discharge: 2018-07-03 | Disposition: A | Discharge status: Home / Self Care | Payer: 59 | Source: Ambulatory Visit | Attending: Internal Medicine | Admitting: Internal Medicine

## 2018-07-03 ENCOUNTER — Ambulatory Visit
Admission: RE | Admit: 2018-07-03 | Discharge: 2018-07-03 | Disposition: A | Discharge status: Home / Self Care | Payer: 59 | Source: Ambulatory Visit | Attending: Obstetrics and Gynecology | Admitting: Obstetrics and Gynecology

## 2018-07-03 ENCOUNTER — Other Ambulatory Visit: Payer: Self-pay

## 2018-07-03 DIAGNOSIS — M810 Age-related osteoporosis without current pathological fracture: Secondary | ICD-10-CM | POA: Diagnosis not present

## 2018-07-03 DIAGNOSIS — Z1231 Encounter for screening mammogram for malignant neoplasm of breast: Secondary | ICD-10-CM

## 2018-09-08 MED FILL — SYNTHROID 75 MCG TABLET: 75 | 90 days supply | Qty: 90 | Fill #1

## 2018-09-09 DIAGNOSIS — Z0000 Encounter for general adult medical examination without abnormal findings: Secondary | ICD-10-CM | POA: Diagnosis not present

## 2018-10-28 DIAGNOSIS — H524 Presbyopia: Secondary | ICD-10-CM | POA: Diagnosis not present

## 2018-11-26 ENCOUNTER — Ambulatory Visit (INDEPENDENT_AMBULATORY_CARE_PROVIDER_SITE_OTHER): Payer: 59

## 2018-11-26 ENCOUNTER — Encounter: Payer: Self-pay | Admitting: Podiatry

## 2018-11-26 ENCOUNTER — Ambulatory Visit: Payer: 59 | Admitting: Podiatry

## 2018-11-26 ENCOUNTER — Other Ambulatory Visit: Payer: Self-pay

## 2018-11-26 VITALS — BP 131/79

## 2018-11-26 DIAGNOSIS — M79672 Pain in left foot: Secondary | ICD-10-CM

## 2018-11-26 DIAGNOSIS — M779 Enthesopathy, unspecified: Secondary | ICD-10-CM | POA: Diagnosis not present

## 2018-11-26 DIAGNOSIS — M79671 Pain in right foot: Secondary | ICD-10-CM

## 2018-11-26 DIAGNOSIS — M7752 Other enthesopathy of left foot: Secondary | ICD-10-CM | POA: Diagnosis not present

## 2018-11-26 DIAGNOSIS — M7751 Other enthesopathy of right foot: Secondary | ICD-10-CM

## 2018-11-26 DIAGNOSIS — M21619 Bunion of unspecified foot: Secondary | ICD-10-CM | POA: Diagnosis not present

## 2018-11-26 DIAGNOSIS — L600 Ingrowing nail: Secondary | ICD-10-CM

## 2018-11-26 MED ORDER — NEOMYCIN-POLYMYXIN-HC 3.5-10000-1 OT SOLN: OTIC | 0 refills | Status: AC

## 2018-11-26 MED FILL — NEO/POLYMYXIN/HC EAR SOLN: 3.5-10000-1 | 30 days supply | Qty: 10 | Fill #0

## 2018-11-26 NOTE — Patient Instructions (Signed)

## 2018-11-26 NOTE — Progress Notes (Signed)
Subjective:   Patient ID: Lauren Yates, female   DOB: 63 y.o.   MRN: [id number]   HPI Patient presents stating she is got a painful ingrown toenail of her right big toe history of bunions hammertoes flatfeet and is interested in orthotics for the long-term.  States it is been an ongoing issue and gradually becoming more of a problem for her with corn callus formation.  Patient does not smoke likes to be active   Review of Systems  All other systems reviewed and are negative.       Objective:  Physical Exam Vitals signs and nursing note reviewed.  Constitutional:      Appearance: She is well-developed.  Pulmonary:     Effort: Pulmonary effort is normal.  Musculoskeletal: Normal range of motion.  Skin:    General: Skin is warm.  Neurological:     Mental Status: She is alert.   Neurovascular status was found to be intact muscle strength adequate range of motion within normal limits.  Patient is noted to have an incurvated lateral border right hallux with the position of the hallux against the second toe part of the problem.  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  I explained procedure as she wants surgery and I infiltrated the right hallux 60 mg like Marcaine mixture sterile prep applied and using sterile instrumentation remove lateral border exposed matrix applied phenol 3 applications 30 seconds followed by alcohol lavage and sterile dressing.  I then went ahead casted for functional orthotics to reduce stress discussed a sport type orthotic that  we will start with with the consideration long-term for a shorter graphite orthotic for other types of shoes.  Patient will be seen back when orthotics are ready and will see [place] for other issues and ultimately may require treatment of bunion hammertoe deformity and we will need to watch midfoot arthritis left  X-rays indicate that there is midfoot arthritis left structural bunion deformity bilateral hammertoe deformity

## 2018-11-27 ENCOUNTER — Other Ambulatory Visit: Payer: Self-pay | Admitting: Podiatry

## 2018-11-27 DIAGNOSIS — M779 Enthesopathy, unspecified: Secondary | ICD-10-CM

## 2019-01-02 MED FILL — SYNTHROID 75 MCG TABLET: 75 | 90 days supply | Qty: 90 | Fill #2

## 2019-01-05 DIAGNOSIS — Z6828 Body mass index (BMI) 28.0-28.9, adult: Secondary | ICD-10-CM | POA: Diagnosis not present

## 2019-01-05 DIAGNOSIS — M169 Osteoarthritis of hip, unspecified: Secondary | ICD-10-CM | POA: Diagnosis not present

## 2019-01-05 DIAGNOSIS — E039 Hypothyroidism, unspecified: Secondary | ICD-10-CM | POA: Diagnosis not present

## 2019-01-05 DIAGNOSIS — M1990 Unspecified osteoarthritis, unspecified site: Secondary | ICD-10-CM | POA: Diagnosis not present

## 2019-01-05 DIAGNOSIS — J309 Allergic rhinitis, unspecified: Secondary | ICD-10-CM | POA: Diagnosis not present

## 2019-01-05 DIAGNOSIS — K582 Mixed irritable bowel syndrome: Secondary | ICD-10-CM | POA: Diagnosis not present

## 2019-01-05 MED FILL — AZELASTINE HCL 137 MCG SPRY: 0.1 | 25 days supply | Qty: 30 | Fill #0

## 2019-01-05 MED FILL — DICLOFENAC SODIUM 1 % GEL: 1 | 20 days supply | Qty: 500 | Fill #0

## 2019-01-05 MED FILL — LINZESS 72 MCG CAPSULE: 72 | 90 days supply | Qty: 90 | Fill #0

## 2019-01-13 DIAGNOSIS — Z23 Encounter for immunization: Secondary | ICD-10-CM | POA: Diagnosis not present

## 2019-03-03 DIAGNOSIS — K582 Mixed irritable bowel syndrome: Secondary | ICD-10-CM | POA: Diagnosis not present

## 2019-03-03 DIAGNOSIS — M19049 Primary osteoarthritis, unspecified hand: Secondary | ICD-10-CM | POA: Diagnosis not present

## 2019-03-03 DIAGNOSIS — M1710 Unilateral primary osteoarthritis, unspecified knee: Secondary | ICD-10-CM | POA: Diagnosis not present

## 2019-03-04 DIAGNOSIS — E042 Nontoxic multinodular goiter: Secondary | ICD-10-CM | POA: Diagnosis not present

## 2019-03-04 DIAGNOSIS — E039 Hypothyroidism, unspecified: Secondary | ICD-10-CM | POA: Diagnosis not present

## 2019-03-04 DIAGNOSIS — M810 Age-related osteoporosis without current pathological fracture: Secondary | ICD-10-CM | POA: Diagnosis not present

## 2019-03-09 ENCOUNTER — Other Ambulatory Visit: Payer: Self-pay | Admitting: Internal Medicine

## 2019-03-09 DIAGNOSIS — E042 Nontoxic multinodular goiter: Secondary | ICD-10-CM | POA: Diagnosis not present

## 2019-03-09 DIAGNOSIS — E039 Hypothyroidism, unspecified: Secondary | ICD-10-CM | POA: Diagnosis not present

## 2019-03-09 DIAGNOSIS — M810 Age-related osteoporosis without current pathological fracture: Secondary | ICD-10-CM | POA: Diagnosis not present

## 2019-03-18 ENCOUNTER — Ambulatory Visit
Admission: RE | Admit: 2019-03-18 | Discharge: 2019-03-18 | Disposition: A | Discharge status: Home / Self Care | Payer: 59 | Source: Ambulatory Visit | Attending: Internal Medicine | Admitting: Internal Medicine

## 2019-03-18 DIAGNOSIS — E042 Nontoxic multinodular goiter: Secondary | ICD-10-CM

## 2019-04-07 MED FILL — LINZESS 72 MCG CAPSULE: 72 | 90 days supply | Qty: 90 | Fill #1

## 2019-04-07 MED FILL — SYNTHROID 75 MCG TABLET: 75 | 90 days supply | Qty: 90 | Fill #0

## 2019-04-15 DIAGNOSIS — Z01419 Encounter for gynecological examination (general) (routine) without abnormal findings: Secondary | ICD-10-CM | POA: Diagnosis not present

## 2019-04-15 DIAGNOSIS — Z98871 Personal history of in utero procedure while a fetus: Secondary | ICD-10-CM | POA: Diagnosis not present

## 2019-04-22 DIAGNOSIS — L578 Other skin changes due to chronic exposure to nonionizing radiation: Secondary | ICD-10-CM | POA: Diagnosis not present

## 2019-04-22 DIAGNOSIS — L905 Scar conditions and fibrosis of skin: Secondary | ICD-10-CM | POA: Diagnosis not present

## 2019-04-22 DIAGNOSIS — L814 Other melanin hyperpigmentation: Secondary | ICD-10-CM | POA: Diagnosis not present

## 2019-04-22 DIAGNOSIS — L859 Epidermal thickening, unspecified: Secondary | ICD-10-CM | POA: Diagnosis not present

## 2019-04-22 DIAGNOSIS — D225 Melanocytic nevi of trunk: Secondary | ICD-10-CM | POA: Diagnosis not present

## 2019-04-22 DIAGNOSIS — L821 Other seborrheic keratosis: Secondary | ICD-10-CM | POA: Diagnosis not present

## 2019-05-18 DIAGNOSIS — Z0184 Encounter for antibody response examination: Secondary | ICD-10-CM | POA: Diagnosis not present

## 2019-05-18 DIAGNOSIS — Z0000 Encounter for general adult medical examination without abnormal findings: Secondary | ICD-10-CM | POA: Diagnosis not present

## 2019-05-27 DIAGNOSIS — Z6829 Body mass index (BMI) 29.0-29.9, adult: Secondary | ICD-10-CM | POA: Diagnosis not present

## 2019-05-27 DIAGNOSIS — Z1211 Encounter for screening for malignant neoplasm of colon: Secondary | ICD-10-CM | POA: Diagnosis not present

## 2019-05-27 DIAGNOSIS — Z0000 Encounter for general adult medical examination without abnormal findings: Secondary | ICD-10-CM | POA: Diagnosis not present

## 2019-07-07 ENCOUNTER — Other Ambulatory Visit: Payer: Self-pay | Admitting: Obstetrics and Gynecology

## 2019-07-07 DIAGNOSIS — Z1231 Encounter for screening mammogram for malignant neoplasm of breast: Secondary | ICD-10-CM

## 2019-07-10 ENCOUNTER — Ambulatory Visit
Admission: RE | Admit: 2019-07-10 | Discharge: 2019-07-10 | Disposition: A | Discharge status: Home / Self Care | Payer: 59 | Source: Ambulatory Visit | Attending: Obstetrics and Gynecology | Admitting: Obstetrics and Gynecology

## 2019-07-10 ENCOUNTER — Other Ambulatory Visit: Payer: Self-pay

## 2019-07-10 DIAGNOSIS — Z1231 Encounter for screening mammogram for malignant neoplasm of breast: Secondary | ICD-10-CM | POA: Diagnosis not present

## 2019-07-10 MED FILL — LINZESS 72 MCG CAPSULE: 72 | 30 days supply | Qty: 30 | Fill #0

## 2019-07-16 ENCOUNTER — Other Ambulatory Visit (HOSPITAL_COMMUNITY): Payer: Self-pay | Admitting: Internal Medicine

## 2019-07-16 MED FILL — SYNTHROID 75 MCG TABLET: 75 | 90 days supply | Qty: 90 | Fill #0

## 2019-07-23 DIAGNOSIS — Z23 Encounter for immunization: Secondary | ICD-10-CM | POA: Diagnosis not present

## 2019-08-11 ENCOUNTER — Other Ambulatory Visit (HOSPITAL_COMMUNITY): Payer: Self-pay | Admitting: Family Medicine

## 2019-08-11 DIAGNOSIS — J309 Allergic rhinitis, unspecified: Secondary | ICD-10-CM | POA: Diagnosis not present

## 2019-08-11 DIAGNOSIS — K589 Irritable bowel syndrome without diarrhea: Secondary | ICD-10-CM | POA: Diagnosis not present

## 2019-08-11 DIAGNOSIS — M1990 Unspecified osteoarthritis, unspecified site: Secondary | ICD-10-CM | POA: Diagnosis not present

## 2019-08-11 MED FILL — DICLOFENAC SODIUM 1 % GEL: 1 | 31 days supply | Qty: 500 | Fill #0

## 2019-08-11 MED FILL — LINZESS 72 MCG CAPSULE: 72 | 90 days supply | Qty: 90 | Fill #0

## 2019-08-11 MED FILL — AZELASTINE HCL 137 MCG/SPRA: 137 | 90 days supply | Qty: 60 | Fill #0

## 2019-10-15 MED FILL — SYNTHROID 75 MCG TABLET: 75 | 90 days supply | Qty: 90 | Fill #1

## 2019-11-02 DIAGNOSIS — H524 Presbyopia: Secondary | ICD-10-CM | POA: Diagnosis not present

## 2019-11-17 MED FILL — LINZESS 72 MCG CAPSULE: 72 | 90 days supply | Qty: 90 | Fill #1

## 2019-12-18 ENCOUNTER — Other Ambulatory Visit (HOSPITAL_COMMUNITY): Payer: Self-pay | Admitting: Family Medicine

## 2019-12-18 DIAGNOSIS — I10 Essential (primary) hypertension: Secondary | ICD-10-CM | POA: Diagnosis not present

## 2019-12-18 MED FILL — VALSARTAN 40 MG TABS: 40 | 90 days supply | Qty: 90 | Fill #0

## 2020-01-18 DIAGNOSIS — I10 Essential (primary) hypertension: Secondary | ICD-10-CM | POA: Diagnosis not present

## 2020-01-19 MED FILL — SYNTHROID 75 MCG TABLET: 75 | 90 days supply | Qty: 90 | Fill #2

## 2020-03-07 DIAGNOSIS — M810 Age-related osteoporosis without current pathological fracture: Secondary | ICD-10-CM | POA: Diagnosis not present

## 2020-03-07 DIAGNOSIS — E039 Hypothyroidism, unspecified: Secondary | ICD-10-CM | POA: Diagnosis not present

## 2020-03-18 DIAGNOSIS — M810 Age-related osteoporosis without current pathological fracture: Secondary | ICD-10-CM | POA: Diagnosis not present

## 2020-03-18 DIAGNOSIS — E042 Nontoxic multinodular goiter: Secondary | ICD-10-CM | POA: Diagnosis not present

## 2020-03-18 DIAGNOSIS — E039 Hypothyroidism, unspecified: Secondary | ICD-10-CM | POA: Diagnosis not present

## 2020-04-06 DIAGNOSIS — L820 Inflamed seborrheic keratosis: Secondary | ICD-10-CM | POA: Diagnosis not present

## 2020-04-06 DIAGNOSIS — D2230 Melanocytic nevi of unspecified part of face: Secondary | ICD-10-CM | POA: Diagnosis not present

## 2020-04-06 DIAGNOSIS — D485 Neoplasm of uncertain behavior of skin: Secondary | ICD-10-CM | POA: Diagnosis not present

## 2020-04-15 DIAGNOSIS — Z9189 Other specified personal risk factors, not elsewhere classified: Secondary | ICD-10-CM | POA: Diagnosis not present

## 2020-04-15 DIAGNOSIS — Z7689 Persons encountering health services in other specified circumstances: Secondary | ICD-10-CM | POA: Diagnosis not present

## 2020-04-15 DIAGNOSIS — Z01419 Encounter for gynecological examination (general) (routine) without abnormal findings: Secondary | ICD-10-CM | POA: Diagnosis not present

## 2020-05-05 ENCOUNTER — Other Ambulatory Visit (HOSPITAL_COMMUNITY): Payer: Self-pay

## 2020-05-05 MED FILL — Levothyroxine Sodium Tab 75 MCG: ORAL | 90 days supply | Qty: 90 | Fill #0 | Status: AC

## 2020-05-11 ENCOUNTER — Other Ambulatory Visit: Payer: Self-pay

## 2020-05-11 ENCOUNTER — Other Ambulatory Visit (HOSPITAL_BASED_OUTPATIENT_CLINIC_OR_DEPARTMENT_OTHER): Payer: Self-pay

## 2020-05-11 ENCOUNTER — Ambulatory Visit: Payer: 59 | Attending: Internal Medicine

## 2020-05-11 DIAGNOSIS — Z23 Encounter for immunization: Secondary | ICD-10-CM

## 2020-05-11 MED ORDER — COVID-19 MRNA VACC (MODERNA) 100 MCG/0.5ML IM SUSP
INTRAMUSCULAR | 0 refills | Status: AC
  Filled 2020-05-11: qty 0.25, 1d supply, fill #0

## 2020-05-11 NOTE — Progress Notes (Signed)
   Covid-[facility] Vaccination Clinic  Name:  Lauren Yates    MRN: [id number] DOB: 09/24/1955  05/11/2020  Ms. Bills was observed post Covid-19 immunization for 15 minutes without incident. She was provided with Vaccine Information Sheet and instruction to access the V-Safe system.   [name] was instructed to call 911 with any severe reactions post vaccine: Marland Kitchen Difficulty breathing  . Swelling of face and throat  . A fast heartbeat  . A bad rash all over body  . Dizziness and weakness   Immunizations Administered    Name Date Dose VIS Date Route   Moderna Covid-19 Booster Vaccine 05/11/2020 10:55 AM 0.25 mL 10/28/2019 Intramuscular   Manufacturer: Moderna   Lot: [id number]   [employer]: 92010-071-21

## 2020-05-24 ENCOUNTER — Other Ambulatory Visit (HOSPITAL_COMMUNITY): Payer: Self-pay

## 2020-05-24 DIAGNOSIS — I10 Essential (primary) hypertension: Secondary | ICD-10-CM | POA: Diagnosis not present

## 2020-05-24 DIAGNOSIS — G4700 Insomnia, unspecified: Secondary | ICD-10-CM | POA: Diagnosis not present

## 2020-05-24 DIAGNOSIS — B359 Dermatophytosis, unspecified: Secondary | ICD-10-CM | POA: Diagnosis not present

## 2020-05-24 MED ORDER — NYSTATIN 100000 UNIT/GM EX OINT
TOPICAL_OINTMENT | CUTANEOUS | 1 refills | Status: AC
  Filled 2020-05-24: qty 30, 10d supply, fill #0

## 2020-05-24 MED ORDER — ALPRAZOLAM 0.25 MG PO TABS
0.2500 mg | ORAL_TABLET | ORAL | 0 refills | Status: AC
  Filled 2020-05-24: qty 30, 6d supply, fill #0

## 2020-05-25 ENCOUNTER — Other Ambulatory Visit (HOSPITAL_COMMUNITY): Payer: Self-pay

## 2020-05-25 MED ORDER — PEG 3350-KCL-NA BICARB-NACL 420 G PO SOLR
ORAL | 0 refills | Status: AC
  Filled 2020-05-25: qty 4000, 1d supply, fill #0

## 2020-05-29 DIAGNOSIS — Z20822 Contact with and (suspected) exposure to covid-19: Secondary | ICD-10-CM | POA: Diagnosis not present

## 2020-06-17 ENCOUNTER — Other Ambulatory Visit (HOSPITAL_COMMUNITY): Payer: Self-pay

## 2020-06-17 MED FILL — Linaclotide Cap 72 MCG: ORAL | 90 days supply | Qty: 90 | Fill #0 | Status: AC

## 2020-06-20 DIAGNOSIS — Z0000 Encounter for general adult medical examination without abnormal findings: Secondary | ICD-10-CM | POA: Diagnosis not present

## 2020-06-23 DIAGNOSIS — K635 Polyp of colon: Secondary | ICD-10-CM | POA: Diagnosis not present

## 2020-06-23 DIAGNOSIS — K5730 Diverticulosis of large intestine without perforation or abscess without bleeding: Secondary | ICD-10-CM | POA: Diagnosis not present

## 2020-06-23 DIAGNOSIS — Z86010 Personal history of colonic polyps: Secondary | ICD-10-CM | POA: Diagnosis not present

## 2020-06-29 ENCOUNTER — Other Ambulatory Visit (HOSPITAL_COMMUNITY): Payer: Self-pay

## 2020-06-29 ENCOUNTER — Other Ambulatory Visit: Payer: Self-pay | Admitting: Obstetrics and Gynecology

## 2020-06-29 DIAGNOSIS — Z1231 Encounter for screening mammogram for malignant neoplasm of breast: Secondary | ICD-10-CM

## 2020-06-29 DIAGNOSIS — I10 Essential (primary) hypertension: Secondary | ICD-10-CM | POA: Diagnosis not present

## 2020-06-29 DIAGNOSIS — K589 Irritable bowel syndrome without diarrhea: Secondary | ICD-10-CM | POA: Diagnosis not present

## 2020-06-29 DIAGNOSIS — Z0000 Encounter for general adult medical examination without abnormal findings: Secondary | ICD-10-CM | POA: Diagnosis not present

## 2020-06-29 DIAGNOSIS — Z23 Encounter for immunization: Secondary | ICD-10-CM | POA: Diagnosis not present

## 2020-06-29 DIAGNOSIS — J309 Allergic rhinitis, unspecified: Secondary | ICD-10-CM | POA: Diagnosis not present

## 2020-06-29 MED ORDER — LINZESS 72 MCG PO CAPS
ORAL_CAPSULE | ORAL | 3 refills | Status: AC
  Filled 2020-06-29: qty 90, 90d supply, fill #0

## 2020-06-29 MED ORDER — AZELASTINE HCL 0.1 % NA SOLN
NASAL | 3 refills | Status: AC
  Filled 2020-06-29: qty 30, 50d supply, fill #0

## 2020-06-30 ENCOUNTER — Other Ambulatory Visit (HOSPITAL_COMMUNITY): Payer: Self-pay

## 2020-07-25 ENCOUNTER — Ambulatory Visit
Admission: RE | Admit: 2020-07-25 | Discharge: 2020-07-25 | Disposition: A | Discharge status: Home / Self Care | Payer: 59 | Source: Ambulatory Visit | Attending: Obstetrics and Gynecology | Admitting: Obstetrics and Gynecology

## 2020-07-25 ENCOUNTER — Other Ambulatory Visit: Payer: Self-pay

## 2020-07-25 ENCOUNTER — Inpatient Hospital Stay: Admission: RE | Admit: 2020-07-25 | Payer: 59 | Source: Ambulatory Visit

## 2020-07-25 DIAGNOSIS — Z1231 Encounter for screening mammogram for malignant neoplasm of breast: Secondary | ICD-10-CM | POA: Diagnosis not present

## 2020-08-08 ENCOUNTER — Other Ambulatory Visit (HOSPITAL_COMMUNITY): Payer: Self-pay

## 2020-08-08 MED ORDER — SYNTHROID 75 MCG PO TABS
ORAL_TABLET | ORAL | 3 refills | Status: AC
  Filled 2020-08-08: qty 90, 90d supply, fill #0
  Filled 2020-11-18: qty 90, 90d supply, fill #1
  Filled 2021-02-28: qty 90, 90d supply, fill #2

## 2020-08-29 ENCOUNTER — Other Ambulatory Visit (HOSPITAL_COMMUNITY): Payer: Self-pay

## 2020-08-29 DIAGNOSIS — U071 COVID-19: Secondary | ICD-10-CM | POA: Diagnosis not present

## 2020-08-29 MED ORDER — LIDOCAINE VISCOUS HCL 2 % MT SOLN
OROMUCOSAL | 0 refills | Status: AC
  Filled 2020-08-29: qty 100, 1d supply, fill #0

## 2020-08-30 ENCOUNTER — Other Ambulatory Visit (HOSPITAL_COMMUNITY): Payer: Self-pay

## 2020-08-30 MED ORDER — CARESTART COVID-19 HOME TEST VI KIT
PACK | 0 refills | Status: AC
  Filled 2020-08-30: qty 4, 4d supply, fill #0

## 2020-09-01 DIAGNOSIS — U071 COVID-19: Secondary | ICD-10-CM | POA: Diagnosis not present

## 2020-09-16 ENCOUNTER — Other Ambulatory Visit (HOSPITAL_COMMUNITY): Payer: Self-pay

## 2020-10-10 ENCOUNTER — Other Ambulatory Visit (HOSPITAL_BASED_OUTPATIENT_CLINIC_OR_DEPARTMENT_OTHER): Payer: Self-pay

## 2020-10-10 MED ORDER — FLUAD QUADRIVALENT 0.5 ML IM PRSY
PREFILLED_SYRINGE | INTRAMUSCULAR | 0 refills | Status: AC
  Filled 2020-10-10: qty 0.5, 1d supply, fill #0

## 2020-11-01 ENCOUNTER — Other Ambulatory Visit (HOSPITAL_COMMUNITY): Payer: Self-pay

## 2020-11-04 ENCOUNTER — Other Ambulatory Visit (HOSPITAL_BASED_OUTPATIENT_CLINIC_OR_DEPARTMENT_OTHER): Payer: Self-pay

## 2020-11-04 MED ORDER — OMRON 3 SERIES BP MONITOR DEVI
0 refills | Status: AC
  Filled 2020-11-04: qty 1, 30d supply, fill #0

## 2020-11-07 DIAGNOSIS — H5213 Myopia, bilateral: Secondary | ICD-10-CM | POA: Diagnosis not present

## 2020-11-18 ENCOUNTER — Other Ambulatory Visit (HOSPITAL_COMMUNITY): Payer: Self-pay

## 2020-11-21 DIAGNOSIS — H9313 Tinnitus, bilateral: Secondary | ICD-10-CM | POA: Diagnosis not present

## 2020-11-21 DIAGNOSIS — H903 Sensorineural hearing loss, bilateral: Secondary | ICD-10-CM | POA: Diagnosis not present

## 2020-11-26 ENCOUNTER — Other Ambulatory Visit (HOSPITAL_COMMUNITY): Payer: Self-pay

## 2020-11-26 MED FILL — Valsartan Tab 40 MG: ORAL | 90 days supply | Qty: 90 | Fill #0 | Status: AC

## 2020-12-06 DIAGNOSIS — H905 Unspecified sensorineural hearing loss: Secondary | ICD-10-CM | POA: Diagnosis not present

## 2020-12-10 ENCOUNTER — Other Ambulatory Visit (HOSPITAL_COMMUNITY): Payer: Self-pay

## 2020-12-10 MED ORDER — PREDNISOLONE ACETATE 1 % OP SUSP
OPHTHALMIC | 1 refills | Status: AC
  Filled 2020-12-10: qty 5, 18d supply, fill #0

## 2020-12-10 MED ORDER — MOXIFLOXACIN HCL 0.5 % OP SOLN
OPHTHALMIC | 0 refills | Status: DC
Start: 2020-11-14 — End: 2021-01-20
  Filled 2020-12-10: qty 3, 15d supply, fill #0

## 2020-12-12 ENCOUNTER — Other Ambulatory Visit (HOSPITAL_COMMUNITY): Payer: Self-pay

## 2020-12-21 ENCOUNTER — Other Ambulatory Visit (HOSPITAL_COMMUNITY): Payer: Self-pay

## 2020-12-21 DIAGNOSIS — J309 Allergic rhinitis, unspecified: Secondary | ICD-10-CM | POA: Diagnosis not present

## 2020-12-21 DIAGNOSIS — K589 Irritable bowel syndrome without diarrhea: Secondary | ICD-10-CM | POA: Diagnosis not present

## 2020-12-21 DIAGNOSIS — Z23 Encounter for immunization: Secondary | ICD-10-CM | POA: Diagnosis not present

## 2020-12-21 DIAGNOSIS — I10 Essential (primary) hypertension: Secondary | ICD-10-CM | POA: Diagnosis not present

## 2020-12-21 DIAGNOSIS — M169 Osteoarthritis of hip, unspecified: Secondary | ICD-10-CM | POA: Diagnosis not present

## 2020-12-21 MED ORDER — VALSARTAN 40 MG PO TABS
ORAL_TABLET | ORAL | 3 refills | Status: AC
  Filled 2020-12-21 – 2021-02-28 (×2): qty 90, 90d supply, fill #0
  Filled 2021-06-10: qty 90, 90d supply, fill #1
  Filled 2021-09-21: qty 90, 90d supply, fill #2

## 2020-12-21 MED ORDER — AZELASTINE HCL 0.1 % NA SOLN: NASAL | 3 refills | Status: AC

## 2020-12-21 MED ORDER — AZELASTINE HCL 0.1 % NA SOLN
NASAL | 3 refills | Status: AC
  Filled 2020-12-21: qty 60, 90d supply, fill #0

## 2020-12-21 MED ORDER — LINZESS 72 MCG PO CAPS
ORAL_CAPSULE | ORAL | 3 refills | Status: AC
  Filled 2020-12-21: qty 90, 90d supply, fill #0

## 2020-12-25 IMAGING — MG DIGITAL SCREENING BILATERAL MAMMOGRAM WITH TOMO AND CAD
8 series · 8 of 24 positions shown · non-contrast
Comparison: Previous exam(s).

CLINICAL DATA: Screening.

EXAM:
DIGITAL SCREENING BILATERAL MAMMOGRAM WITH TOMO AND CAD

[R MLO synth-2D]
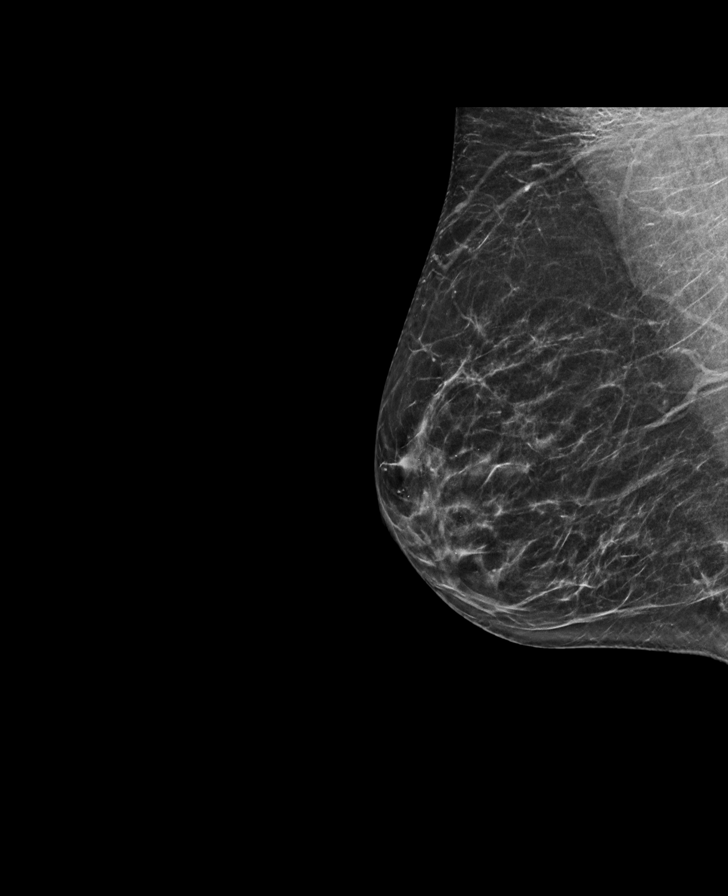

[L MLO synth-2D]
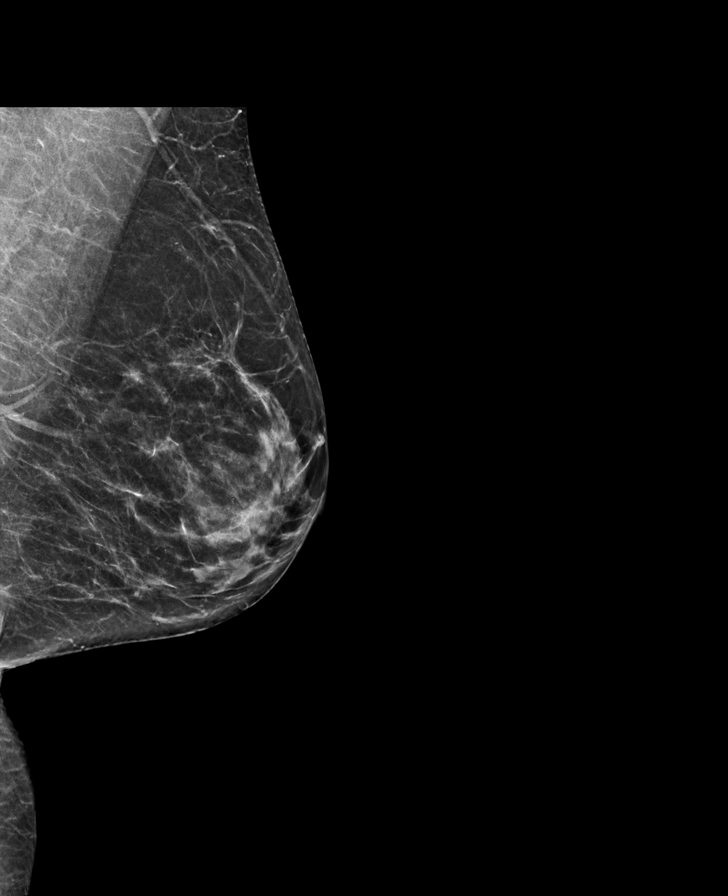

[L CC synth-2D]
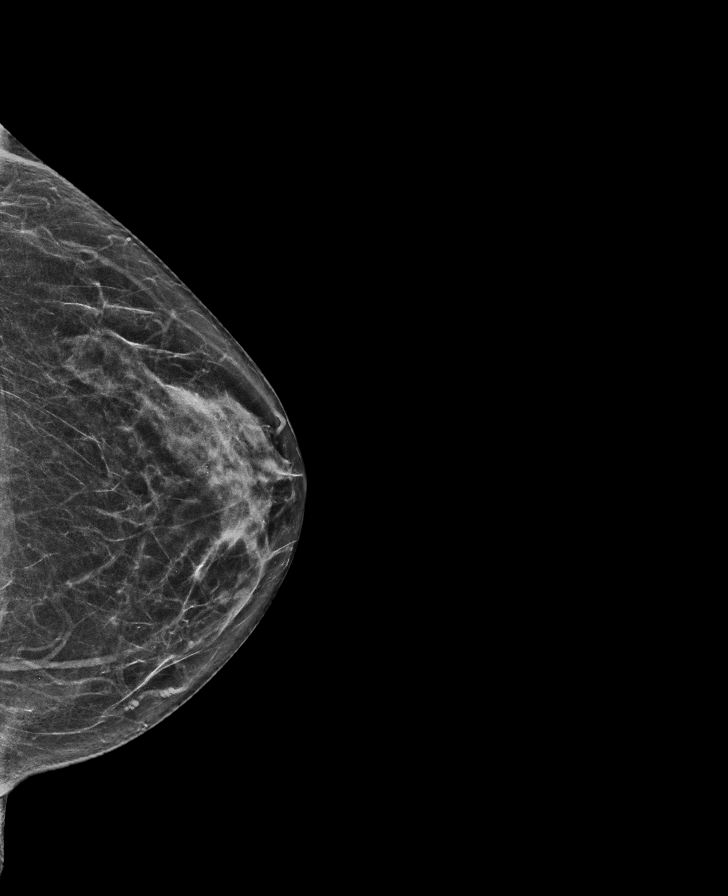

[R CC synth-2D]
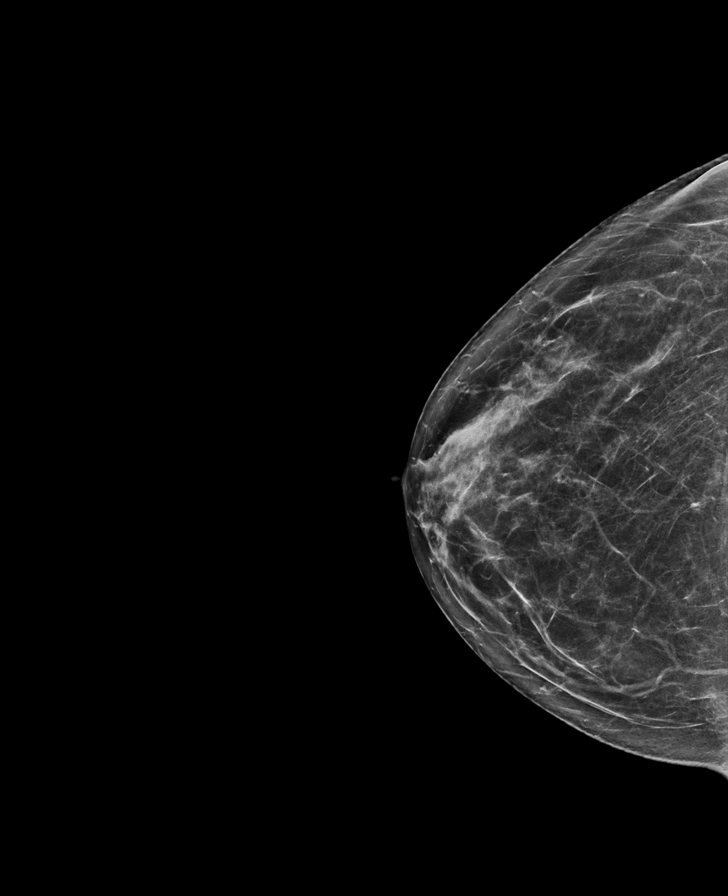

[L CC tomo · tomo slice 31/62.0]
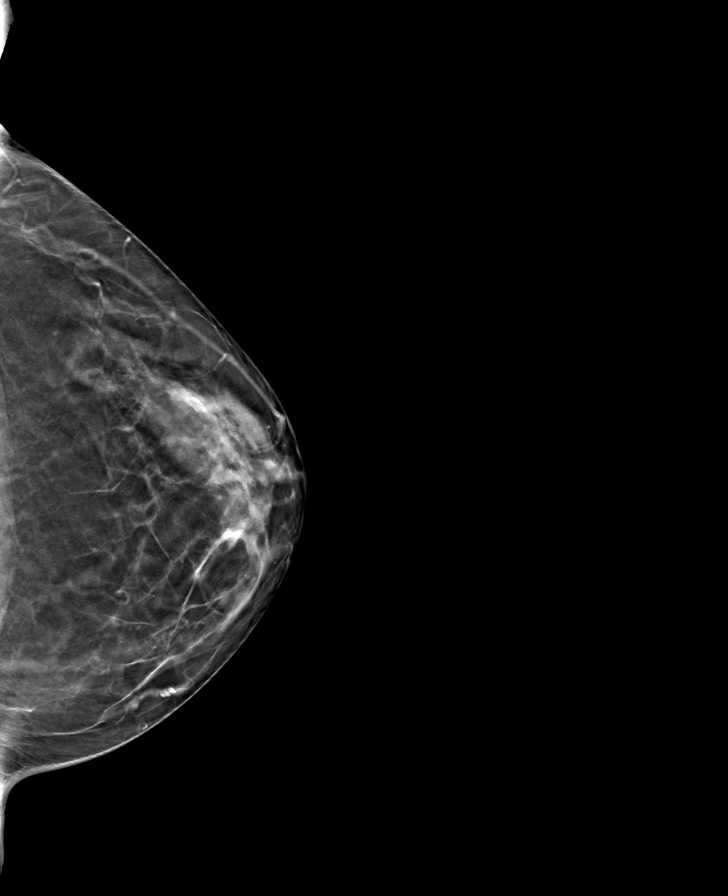

[R MLO tomo · tomo slice 31/60.0]
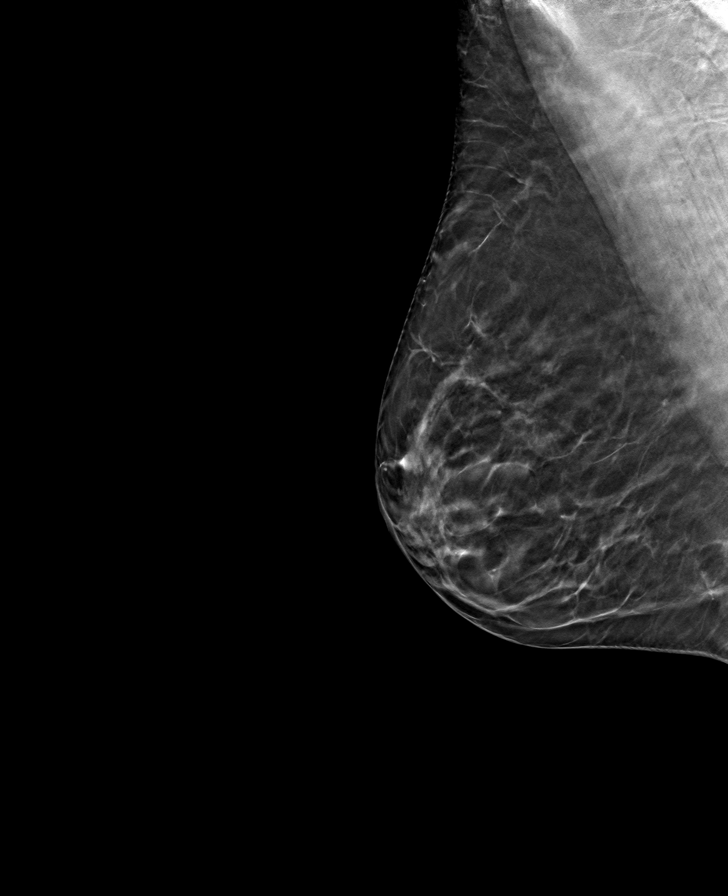

[R CC tomo · tomo slice 33/65.0]
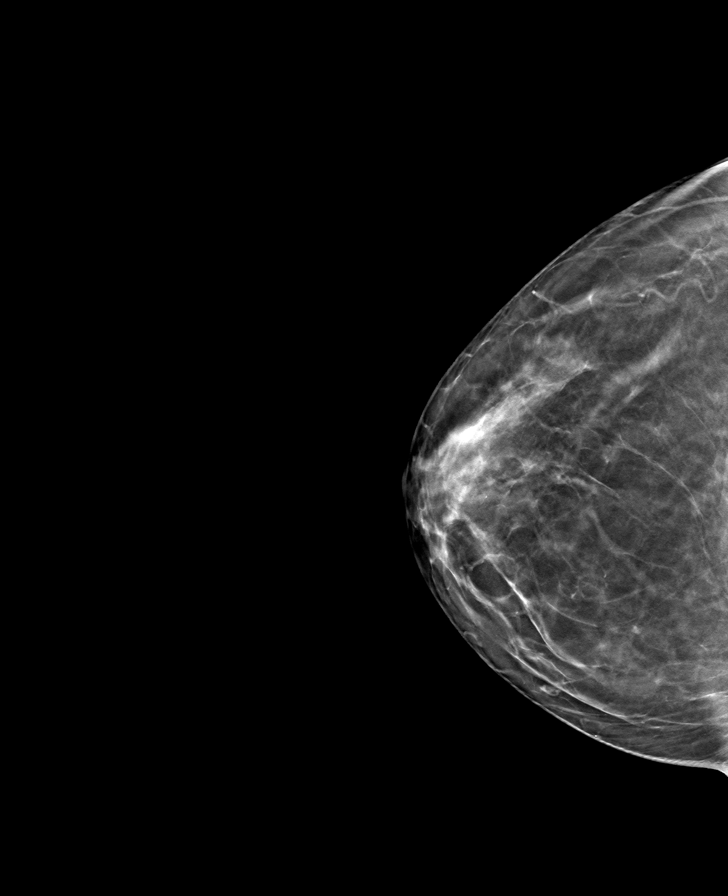

[L MLO tomo · tomo slice 31/61.0]
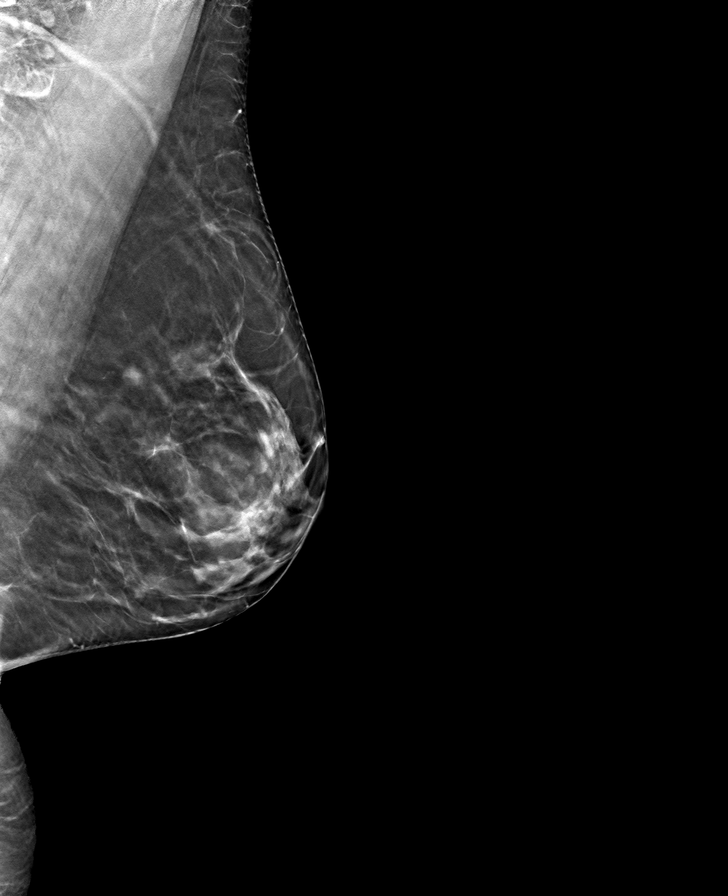

[8 of 24 positions shown; findings below may reference images not displayed]

ACR Breast Density Category c: The breast tissue is heterogeneously
dense, which may obscure small masses.
FINDINGS: There are no findings suspicious for malignancy. Images were
processed with CAD.
IMPRESSION: No mammographic evidence of malignancy. A result letter of this
screening mammogram will be mailed directly to the patient.

RECOMMENDATION:
Screening mammogram in one year. (Code:FT-U-LHB)

BI-RADS CATEGORY  1: Negative.

## 2020-12-29 ENCOUNTER — Other Ambulatory Visit (HOSPITAL_COMMUNITY): Payer: Self-pay

## 2021-01-14 ENCOUNTER — Other Ambulatory Visit (HOSPITAL_COMMUNITY): Payer: Self-pay

## 2021-01-16 ENCOUNTER — Other Ambulatory Visit (HOSPITAL_COMMUNITY): Payer: Self-pay

## 2021-01-16 ENCOUNTER — Telehealth: Payer: Self-pay

## 2021-01-16 NOTE — Telephone Encounter (Signed)
Called to discuss PREP schedule at [facility], will return call when able to talk

## 2021-01-20 ENCOUNTER — Other Ambulatory Visit (HOSPITAL_COMMUNITY): Payer: Self-pay

## 2021-01-20 MED ORDER — PREDNISOLONE ACETATE 1 % OP SUSP
OPHTHALMIC | 0 refills | Status: AC
  Filled 2021-01-20: qty 5, 20d supply, fill #0
  Filled 2021-03-01: qty 5, 20d supply, fill #1

## 2021-01-20 MED ORDER — MOXIFLOXACIN HCL 0.5 % OP SOLN
OPHTHALMIC | 0 refills | Status: AC
  Filled 2021-01-20: qty 3, 12d supply, fill #0

## 2021-02-28 ENCOUNTER — Other Ambulatory Visit (HOSPITAL_COMMUNITY): Payer: Self-pay

## 2021-03-01 ENCOUNTER — Other Ambulatory Visit (HOSPITAL_COMMUNITY): Payer: Self-pay

## 2021-03-22 ENCOUNTER — Other Ambulatory Visit: Payer: Self-pay | Admitting: Internal Medicine

## 2021-03-22 DIAGNOSIS — M810 Age-related osteoporosis without current pathological fracture: Secondary | ICD-10-CM

## 2021-04-18 ENCOUNTER — Other Ambulatory Visit (HOSPITAL_COMMUNITY): Payer: Self-pay

## 2021-04-19 ENCOUNTER — Other Ambulatory Visit (HOSPITAL_COMMUNITY): Payer: Self-pay

## 2021-04-19 MED ORDER — SYNTHROID 75 MCG PO TABS
75.0000 ug | ORAL_TABLET | Freq: Every morning | ORAL | 2 refills | Status: DC
  Filled 2021-04-19 – 2021-06-10 (×2): qty 90, 90d supply, fill #0
  Filled 2021-09-21: qty 90, 90d supply, fill #1

## 2021-06-06 ENCOUNTER — Other Ambulatory Visit: Payer: Self-pay | Admitting: Nurse Practitioner

## 2021-06-06 ENCOUNTER — Other Ambulatory Visit (HOSPITAL_COMMUNITY)
Admission: RE | Admit: 2021-06-06 | Discharge: 2021-06-06 | Disposition: A | Discharge status: Home / Self Care | Payer: Medicare Other | Source: Ambulatory Visit | Attending: Nurse Practitioner | Admitting: Nurse Practitioner

## 2021-06-06 DIAGNOSIS — Z9189 Other specified personal risk factors, not elsewhere classified: Secondary | ICD-10-CM | POA: Diagnosis present

## 2021-06-08 LAB — CYTOLOGY - PAP: Diagnosis: NEGATIVE

## 2021-06-10 ENCOUNTER — Other Ambulatory Visit (HOSPITAL_COMMUNITY): Payer: Self-pay

## 2021-07-04 ENCOUNTER — Other Ambulatory Visit: Payer: Self-pay | Admitting: Obstetrics and Gynecology

## 2021-07-04 DIAGNOSIS — Z1231 Encounter for screening mammogram for malignant neoplasm of breast: Secondary | ICD-10-CM

## 2021-08-01 ENCOUNTER — Ambulatory Visit
Admission: RE | Admit: 2021-08-01 | Discharge: 2021-08-01 | Disposition: A | Discharge status: Home / Self Care | Payer: Medicare Other | Source: Ambulatory Visit | Attending: Obstetrics and Gynecology | Admitting: Obstetrics and Gynecology

## 2021-08-01 DIAGNOSIS — Z1231 Encounter for screening mammogram for malignant neoplasm of breast: Secondary | ICD-10-CM

## 2021-08-19 ENCOUNTER — Other Ambulatory Visit (HOSPITAL_COMMUNITY): Payer: Self-pay

## 2021-09-09 IMAGING — US US THYROID
1 series · 12 of 25 positions shown · non-contrast
Comparison: 02/15/2014

CLINICAL DATA: 63-year-old female with a history of multinodular
thyroid

EXAM:
THYROID ULTRASOUND
TECHNIQUE: Ultrasound examination of the thyroid gland and adjacent soft
tissues was performed.

[Series 1: us thyroid · 0.04mm/px · 12 of 53 slices shown]
[im 3/53]
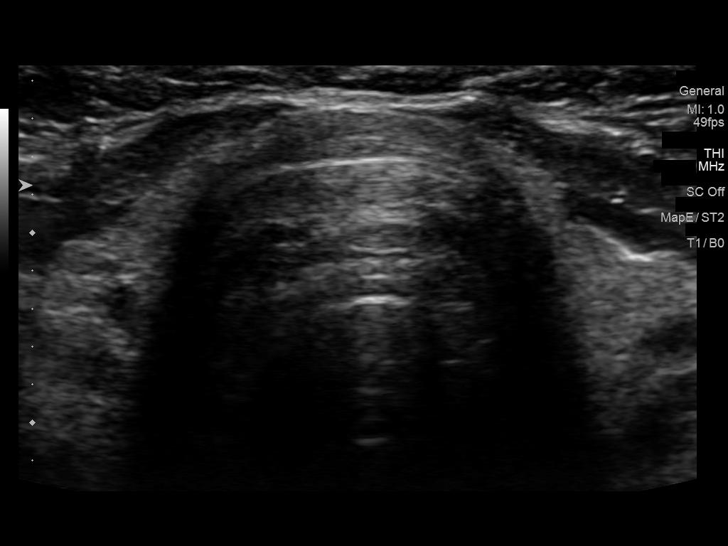
[im 7/53]
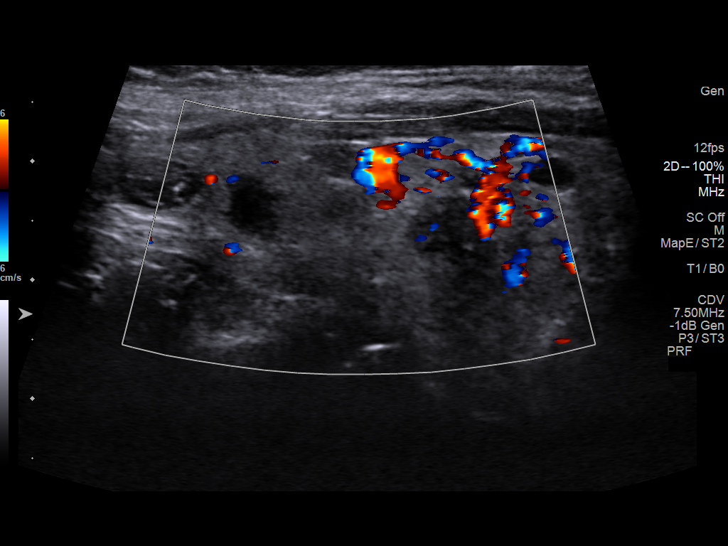
[im 11/53]
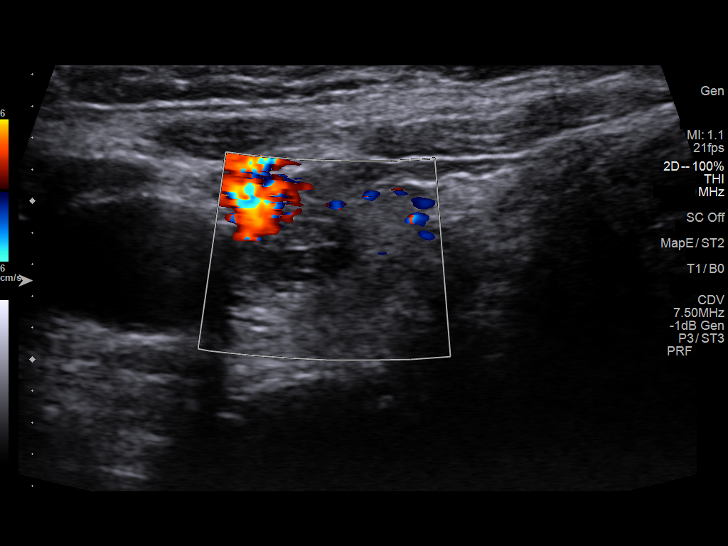
[im 16/53]
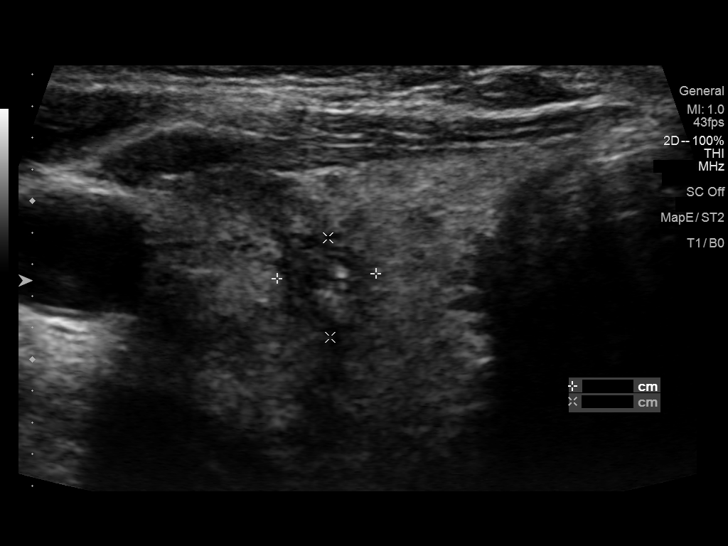
[im 20/53]
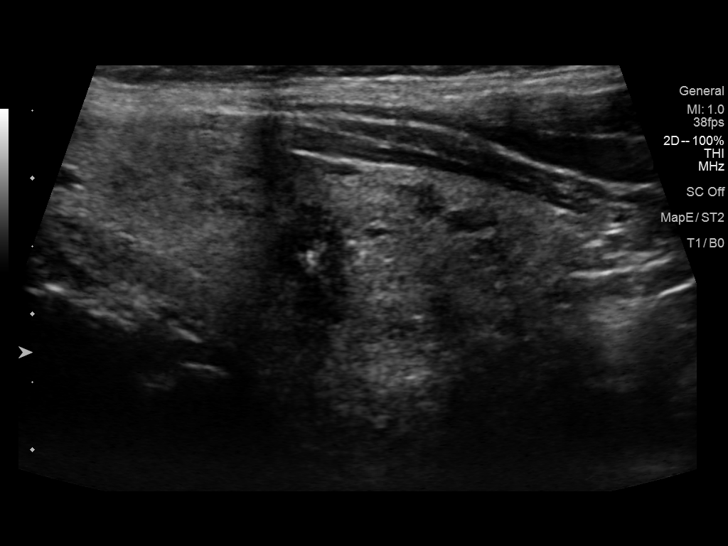
[im 24/53]
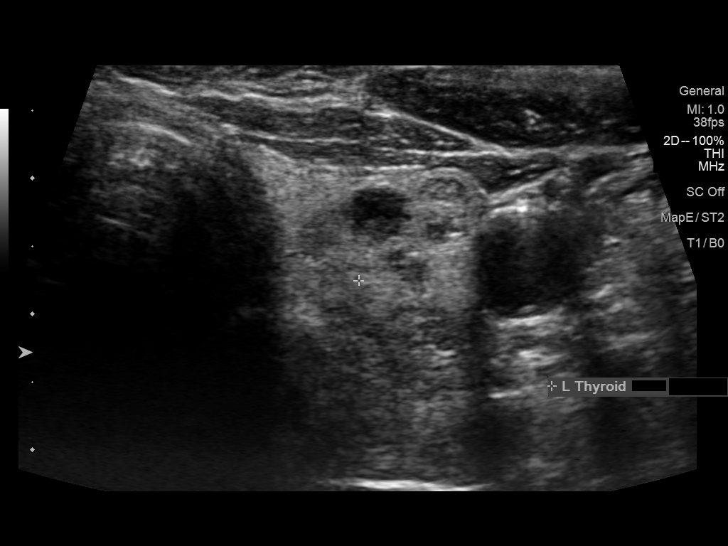
[im 29/53]
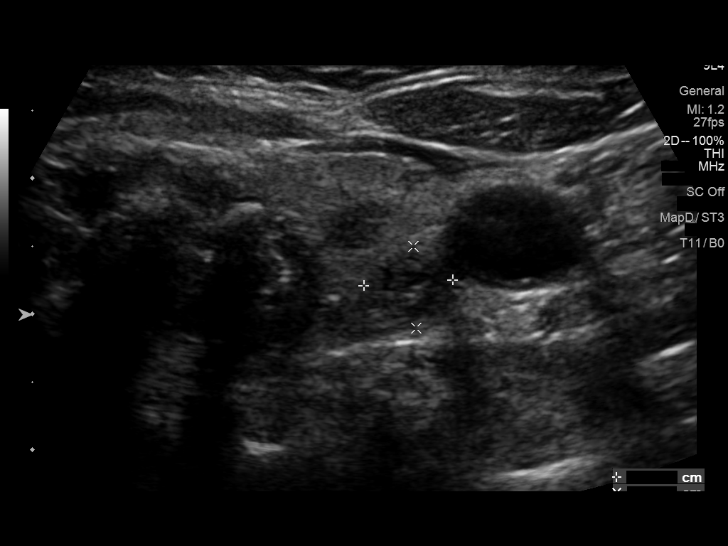
[im 33/53]
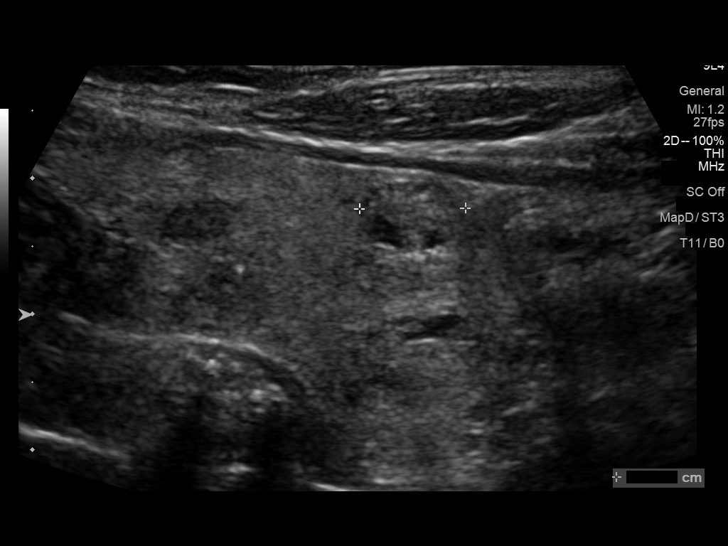
[im 37/53]
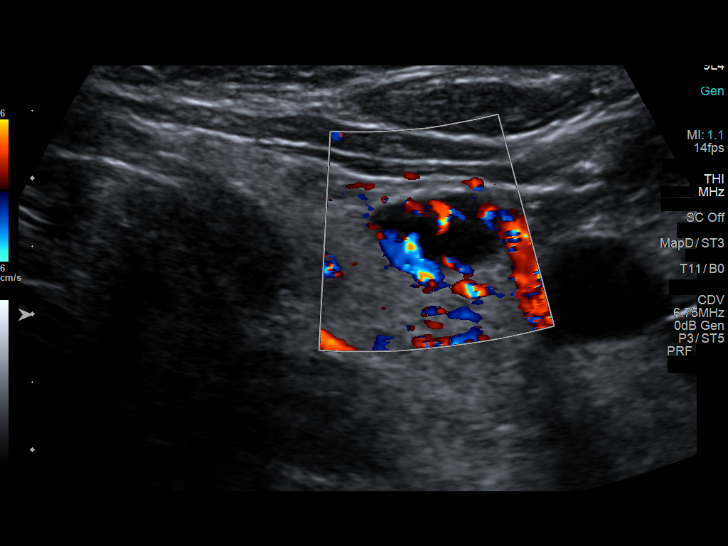
[im 42/53]
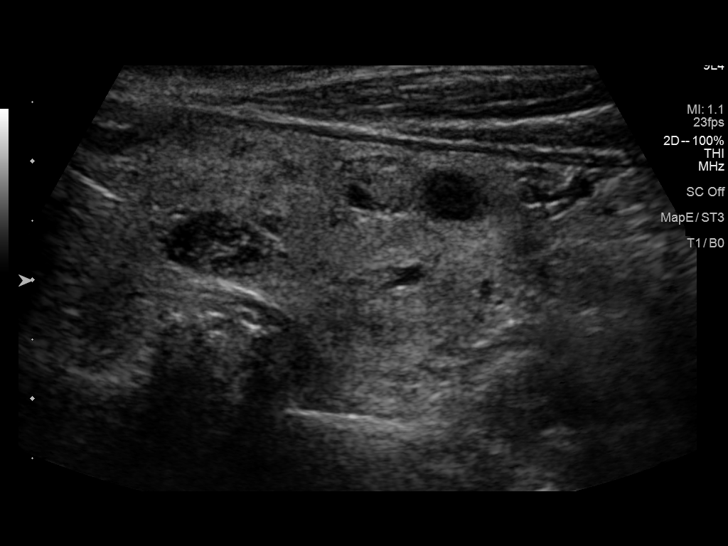
[im 46/53]
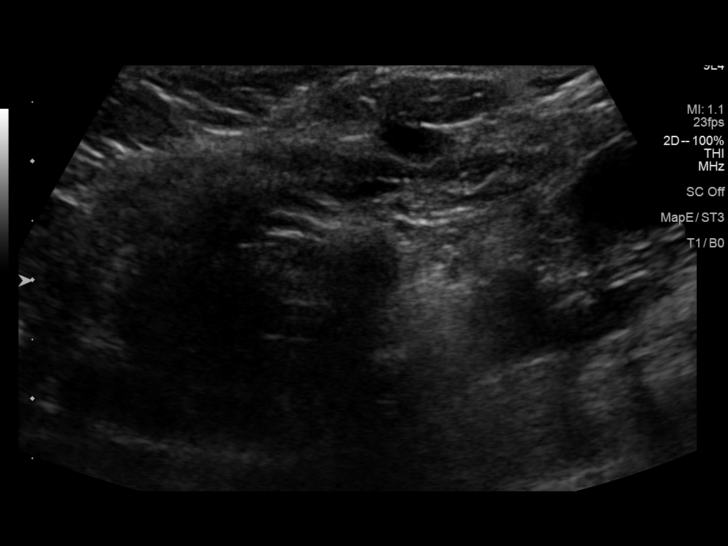
[im 50/53]
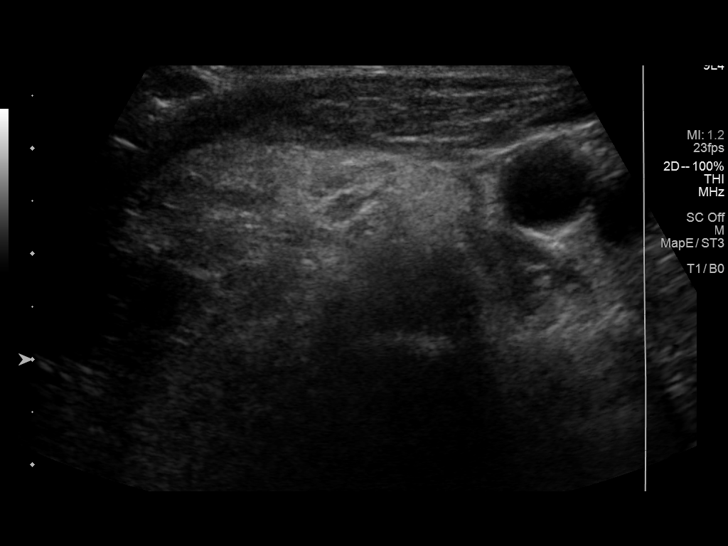

[12 of 25 positions shown; findings below may reference images not displayed]

FINDINGS: Parenchymal Echotexture: Moderately heterogenous

Isthmus: 0.2 cm

Right lobe: 5.1 cm x 1.9 cm x 1.8 cm

Left lobe: 4.0 cm x 2.3 cm x 1.5 cm

_________________________________________________________

Estimated total number of nodules >/= 1 cm: 2

Number of spongiform nodules >/=  2 cm not described below (TR1): 0

Number of mixed cystic and solid nodules >/= 1.5 cm not described
below (TR2): 0

_________________________________________________________

Nodule # 1:

Location: Right; Superior

Maximum size: 0.7 cm; Other 2 dimensions: 0.7 cm x 0.6 cm

Composition: spongiform (0)

ACR TI-RADS recommendations:

Spongiform nodule does not meet criteria for surveillance or biopsy

_________________________________________________________

Nodule # 2:

Location: Right; Mid

Maximum size: 0.7 cm; Other 2 dimensions: 0.7 cm x 0.6 cm

Composition: cystic/almost completely cystic (0)

Echogenicity: anechoic (0)

Shape: not taller-than-wide (0)

Margins: smooth (0)

Echogenic foci: none (0)

ACR TI-RADS total points: 0.

ACR TI-RADS risk category: TR1 (0-1 points).

ACR TI-RADS recommendations:

Cystic nodule does not meet criteria for surveillance or biopsy

_________________________________________________________

Nodule # 3:

Location: Right; Inferior

Maximum size: 0.6 cm; Other 2 dimensions: 0.6 cm x 0.5 cm

Composition: cannot determine (2)

Echogenicity: hypoechoic (2)

Shape: not taller-than-wide (0)

Margins: ill-defined (0)

Echogenic foci: none (0)

ACR TI-RADS total points: 4.

ACR TI-RADS risk category: TR4 (4-6 points).

ACR TI-RADS recommendations:

Nodule does not meet criteria for surveillance or biopsy

_________________________________________________________

Nodule # 4:

Location: Left; Superior

Maximum size: 0.9 cm; Other 2 dimensions: 0.7 cm x 0.6 cm

Composition: spongiform (0)

ACR TI-RADS recommendations:

Spongiform nodule does not meet criteria for surveillance or biopsy

_________________________________________________________

Nodule # 5:

Location: Left; Mid

Maximum size: 20.8 cm; Other 2 dimensions: 0.8 cm x 0.6 cm

Composition: spongiform (0)

ACR TI-RADS recommendations:

Spongiform nodule does not meet criteria for surveillance or biopsy

_________________________________________________________

Nodule # 6:

Location: Left; Inferior

Maximum size: 1.2 cm; Other 2 dimensions: 0.8 cm x 0.7 cm

Composition: cystic/almost completely cystic (0)

Echogenicity: anechoic (0)

Shape: not taller-than-wide (0)

Margins: smooth (0)

Echogenic foci: none (0)

ACR TI-RADS total points: 0.

ACR TI-RADS risk category: TR1 (0-1 points).

ACR TI-RADS recommendations:

Cystic nodule does not meet criteria for surveillance or biopsy

_________________________________________________________

Nodule # 7:

Location: Left; Inferior

Maximum size: 1.1 cm; Other 2 dimensions: 0.9 cm x 0.8 cm

Composition: solid/almost completely solid (2)

Echogenicity: isoechoic (1)

Shape: not taller-than-wide (0)

Margins: ill-defined (0)

Echogenic foci: none (0)

ACR TI-RADS total points: 3.

ACR TI-RADS risk category: TR3 (3 points).

ACR TI-RADS recommendations:

Nodule does not meet criteria for surveillance or biopsy

_________________________________________________________

No adenopathy
IMPRESSION: Multinodular thyroid.

No thyroid nodule meets criteria for biopsy or surveillance, as
designated by the newly established ACR TI-RADS criteria.

Recommendations follow those established by the new ACR TI-RADS
criteria ([HOSPITAL] 3173;[DATE]).

## 2021-09-21 ENCOUNTER — Other Ambulatory Visit (HOSPITAL_COMMUNITY): Payer: Self-pay

## 2022-01-11 ENCOUNTER — Other Ambulatory Visit (HOSPITAL_COMMUNITY): Payer: Self-pay

## 2022-01-12 ENCOUNTER — Other Ambulatory Visit (HOSPITAL_COMMUNITY): Payer: Self-pay

## 2022-01-12 ENCOUNTER — Encounter (HOSPITAL_COMMUNITY): Payer: Self-pay

## 2022-01-12 MED ORDER — VALSARTAN 40 MG PO TABS
40.0000 mg | ORAL_TABLET | Freq: Every day | ORAL | 3 refills | Status: AC
  Filled 2022-01-12 – 2022-01-13 (×3): qty 90, 90d supply, fill #0

## 2022-01-13 ENCOUNTER — Other Ambulatory Visit (HOSPITAL_COMMUNITY): Payer: Self-pay

## 2022-01-15 ENCOUNTER — Other Ambulatory Visit (HOSPITAL_COMMUNITY): Payer: Self-pay

## 2022-02-27 ENCOUNTER — Other Ambulatory Visit (HOSPITAL_COMMUNITY): Payer: Self-pay

## 2022-04-12 ENCOUNTER — Other Ambulatory Visit (HOSPITAL_COMMUNITY): Payer: Self-pay

## 2022-04-13 ENCOUNTER — Other Ambulatory Visit (HOSPITAL_COMMUNITY): Payer: Self-pay

## 2022-04-13 MED ORDER — SYNTHROID 75 MCG PO TABS
75.0000 ug | ORAL_TABLET | Freq: Every morning | ORAL | 3 refills | Status: AC
  Filled 2022-04-13 – 2022-04-16 (×2): qty 90, 90d supply, fill #0

## 2022-04-14 ENCOUNTER — Other Ambulatory Visit (HOSPITAL_COMMUNITY): Payer: Self-pay

## 2022-04-16 ENCOUNTER — Other Ambulatory Visit (HOSPITAL_BASED_OUTPATIENT_CLINIC_OR_DEPARTMENT_OTHER): Payer: Self-pay

## 2022-04-18 ENCOUNTER — Other Ambulatory Visit (HOSPITAL_COMMUNITY): Payer: Self-pay

## 2022-04-18 ENCOUNTER — Other Ambulatory Visit (HOSPITAL_BASED_OUTPATIENT_CLINIC_OR_DEPARTMENT_OTHER): Payer: Self-pay

## 2022-04-18 MED ORDER — SYNTHROID 75 MCG PO TABS
75.0000 ug | ORAL_TABLET | Freq: Every day | ORAL | 3 refills | Status: AC
  Filled 2022-04-18: qty 90, 90d supply, fill #0

## 2022-04-19 ENCOUNTER — Other Ambulatory Visit (HOSPITAL_COMMUNITY): Payer: Self-pay

## 2022-04-30 ENCOUNTER — Other Ambulatory Visit (HOSPITAL_COMMUNITY): Payer: Self-pay

## 2022-05-09 ENCOUNTER — Other Ambulatory Visit: Payer: Self-pay

## 2022-06-22 ENCOUNTER — Other Ambulatory Visit: Payer: Self-pay | Admitting: Obstetrics and Gynecology

## 2022-06-22 ENCOUNTER — Other Ambulatory Visit (HOSPITAL_COMMUNITY)
Admission: RE | Admit: 2022-06-22 | Discharge: 2022-06-22 | Disposition: A | Discharge status: Home / Self Care | Payer: Medicare Other | Source: Ambulatory Visit | Attending: Obstetrics and Gynecology | Admitting: Obstetrics and Gynecology

## 2022-06-22 DIAGNOSIS — Z01419 Encounter for gynecological examination (general) (routine) without abnormal findings: Secondary | ICD-10-CM | POA: Diagnosis present

## 2022-06-22 DIAGNOSIS — Z124 Encounter for screening for malignant neoplasm of cervix: Secondary | ICD-10-CM | POA: Insufficient documentation

## 2022-06-22 DIAGNOSIS — Z1151 Encounter for screening for human papillomavirus (HPV): Secondary | ICD-10-CM | POA: Insufficient documentation

## 2022-06-26 LAB — CYTOLOGY - PAP
Comment: NEGATIVE
Diagnosis: NEGATIVE
High risk HPV: NEGATIVE

## 2022-07-13 ENCOUNTER — Other Ambulatory Visit: Payer: Self-pay | Admitting: Obstetrics and Gynecology

## 2022-07-13 DIAGNOSIS — Z1231 Encounter for screening mammogram for malignant neoplasm of breast: Secondary | ICD-10-CM

## 2022-08-07 ENCOUNTER — Ambulatory Visit
Admission: RE | Admit: 2022-08-07 | Discharge: 2022-08-07 | Disposition: A | Discharge status: Home / Self Care | Payer: Medicare Other | Source: Ambulatory Visit | Attending: Obstetrics and Gynecology | Admitting: Obstetrics and Gynecology

## 2022-08-07 DIAGNOSIS — Z1231 Encounter for screening mammogram for malignant neoplasm of breast: Secondary | ICD-10-CM
